# Patient Record
Sex: Male | Born: 1950
Health system: Southern US, Community
[De-identification: ages and names within clinical notes are randomized; demographics above are authoritative.]

## PROBLEM LIST (undated history)

## (undated) DIAGNOSIS — K219 Gastro-esophageal reflux disease without esophagitis: Secondary | ICD-10-CM

## (undated) DIAGNOSIS — M1990 Unspecified osteoarthritis, unspecified site: Secondary | ICD-10-CM

## (undated) HISTORY — PX: BACK SURGERY: SHX140

---

## 2018-01-19 DIAGNOSIS — L989 Disorder of the skin and subcutaneous tissue, unspecified: Secondary | ICD-10-CM | POA: Diagnosis not present

## 2018-01-19 DIAGNOSIS — R350 Frequency of micturition: Secondary | ICD-10-CM | POA: Diagnosis not present

## 2018-01-19 DIAGNOSIS — N401 Enlarged prostate with lower urinary tract symptoms: Secondary | ICD-10-CM | POA: Diagnosis not present

## 2018-01-19 DIAGNOSIS — M5136 Other intervertebral disc degeneration, lumbar region: Secondary | ICD-10-CM | POA: Diagnosis not present

## 2018-01-19 DIAGNOSIS — K219 Gastro-esophageal reflux disease without esophagitis: Secondary | ICD-10-CM | POA: Diagnosis not present

## 2018-01-19 DIAGNOSIS — N529 Male erectile dysfunction, unspecified: Secondary | ICD-10-CM | POA: Diagnosis not present

## 2018-01-19 DIAGNOSIS — Z136 Encounter for screening for cardiovascular disorders: Secondary | ICD-10-CM | POA: Diagnosis not present

## 2018-01-19 DIAGNOSIS — Z125 Encounter for screening for malignant neoplasm of prostate: Secondary | ICD-10-CM | POA: Diagnosis not present

## 2018-01-19 DIAGNOSIS — Z1211 Encounter for screening for malignant neoplasm of colon: Secondary | ICD-10-CM | POA: Diagnosis not present

## 2018-02-01 DIAGNOSIS — K5730 Diverticulosis of large intestine without perforation or abscess without bleeding: Secondary | ICD-10-CM | POA: Diagnosis not present

## 2018-02-01 DIAGNOSIS — Z1211 Encounter for screening for malignant neoplasm of colon: Secondary | ICD-10-CM | POA: Diagnosis not present

## 2018-02-22 DIAGNOSIS — D1800 Hemangioma unspecified site: Secondary | ICD-10-CM | POA: Diagnosis not present

## 2018-02-22 DIAGNOSIS — C4401 Basal cell carcinoma of skin of lip: Secondary | ICD-10-CM | POA: Diagnosis not present

## 2018-02-22 DIAGNOSIS — D485 Neoplasm of uncertain behavior of skin: Secondary | ICD-10-CM | POA: Diagnosis not present

## 2018-02-22 DIAGNOSIS — L821 Other seborrheic keratosis: Secondary | ICD-10-CM | POA: Diagnosis not present

## 2018-03-04 DIAGNOSIS — N529 Male erectile dysfunction, unspecified: Secondary | ICD-10-CM | POA: Diagnosis not present

## 2018-04-29 DIAGNOSIS — C4401 Basal cell carcinoma of skin of lip: Secondary | ICD-10-CM | POA: Diagnosis not present

## 2018-06-02 DIAGNOSIS — N401 Enlarged prostate with lower urinary tract symptoms: Secondary | ICD-10-CM | POA: Diagnosis not present

## 2018-06-02 DIAGNOSIS — K219 Gastro-esophageal reflux disease without esophagitis: Secondary | ICD-10-CM | POA: Diagnosis not present

## 2018-06-02 DIAGNOSIS — M5136 Other intervertebral disc degeneration, lumbar region: Secondary | ICD-10-CM | POA: Diagnosis not present

## 2018-06-02 DIAGNOSIS — Z125 Encounter for screening for malignant neoplasm of prostate: Secondary | ICD-10-CM | POA: Diagnosis not present

## 2018-06-02 DIAGNOSIS — N529 Male erectile dysfunction, unspecified: Secondary | ICD-10-CM | POA: Diagnosis not present

## 2018-07-26 DIAGNOSIS — Z125 Encounter for screening for malignant neoplasm of prostate: Secondary | ICD-10-CM | POA: Diagnosis not present

## 2018-07-26 DIAGNOSIS — N5201 Erectile dysfunction due to arterial insufficiency: Secondary | ICD-10-CM | POA: Diagnosis not present

## 2018-07-26 DIAGNOSIS — R3915 Urgency of urination: Secondary | ICD-10-CM | POA: Diagnosis not present

## 2018-07-30 DIAGNOSIS — Z23 Encounter for immunization: Secondary | ICD-10-CM | POA: Diagnosis not present

## 2018-08-20 DIAGNOSIS — N5201 Erectile dysfunction due to arterial insufficiency: Secondary | ICD-10-CM | POA: Diagnosis not present

## 2018-09-29 DIAGNOSIS — Z85828 Personal history of other malignant neoplasm of skin: Secondary | ICD-10-CM | POA: Diagnosis not present

## 2018-09-29 DIAGNOSIS — D485 Neoplasm of uncertain behavior of skin: Secondary | ICD-10-CM | POA: Diagnosis not present

## 2018-09-29 DIAGNOSIS — D225 Melanocytic nevi of trunk: Secondary | ICD-10-CM | POA: Diagnosis not present

## 2018-09-29 DIAGNOSIS — L821 Other seborrheic keratosis: Secondary | ICD-10-CM | POA: Diagnosis not present

## 2018-09-29 DIAGNOSIS — L570 Actinic keratosis: Secondary | ICD-10-CM | POA: Diagnosis not present

## 2018-09-29 DIAGNOSIS — L814 Other melanin hyperpigmentation: Secondary | ICD-10-CM | POA: Diagnosis not present

## 2018-09-29 DIAGNOSIS — Z23 Encounter for immunization: Secondary | ICD-10-CM | POA: Diagnosis not present

## 2018-12-17 DIAGNOSIS — Z125 Encounter for screening for malignant neoplasm of prostate: Secondary | ICD-10-CM | POA: Diagnosis not present

## 2018-12-17 DIAGNOSIS — Z0000 Encounter for general adult medical examination without abnormal findings: Secondary | ICD-10-CM | POA: Diagnosis not present

## 2018-12-17 DIAGNOSIS — Z1329 Encounter for screening for other suspected endocrine disorder: Secondary | ICD-10-CM | POA: Diagnosis not present

## 2018-12-17 DIAGNOSIS — N401 Enlarged prostate with lower urinary tract symptoms: Secondary | ICD-10-CM | POA: Diagnosis not present

## 2018-12-17 DIAGNOSIS — M5136 Other intervertebral disc degeneration, lumbar region: Secondary | ICD-10-CM | POA: Diagnosis not present

## 2018-12-17 DIAGNOSIS — Z79899 Other long term (current) drug therapy: Secondary | ICD-10-CM | POA: Diagnosis not present

## 2018-12-17 DIAGNOSIS — M25522 Pain in left elbow: Secondary | ICD-10-CM | POA: Diagnosis not present

## 2018-12-17 DIAGNOSIS — Z13220 Encounter for screening for lipoid disorders: Secondary | ICD-10-CM | POA: Diagnosis not present

## 2018-12-17 DIAGNOSIS — Z23 Encounter for immunization: Secondary | ICD-10-CM | POA: Diagnosis not present

## 2018-12-17 DIAGNOSIS — N529 Male erectile dysfunction, unspecified: Secondary | ICD-10-CM | POA: Diagnosis not present

## 2018-12-17 DIAGNOSIS — Z136 Encounter for screening for cardiovascular disorders: Secondary | ICD-10-CM | POA: Diagnosis not present

## 2019-08-26 DIAGNOSIS — Z23 Encounter for immunization: Secondary | ICD-10-CM | POA: Diagnosis not present

## 2020-01-30 DIAGNOSIS — E7800 Pure hypercholesterolemia, unspecified: Secondary | ICD-10-CM | POA: Diagnosis not present

## 2020-01-30 DIAGNOSIS — Z125 Encounter for screening for malignant neoplasm of prostate: Secondary | ICD-10-CM | POA: Diagnosis not present

## 2020-01-30 DIAGNOSIS — Z79899 Other long term (current) drug therapy: Secondary | ICD-10-CM | POA: Diagnosis not present

## 2020-01-30 DIAGNOSIS — Z0000 Encounter for general adult medical examination without abnormal findings: Secondary | ICD-10-CM | POA: Diagnosis not present

## 2020-02-02 DIAGNOSIS — H9313 Tinnitus, bilateral: Secondary | ICD-10-CM | POA: Diagnosis not present

## 2020-02-02 DIAGNOSIS — Z23 Encounter for immunization: Secondary | ICD-10-CM | POA: Diagnosis not present

## 2020-02-02 DIAGNOSIS — N401 Enlarged prostate with lower urinary tract symptoms: Secondary | ICD-10-CM | POA: Diagnosis not present

## 2020-02-02 DIAGNOSIS — M5136 Other intervertebral disc degeneration, lumbar region: Secondary | ICD-10-CM | POA: Diagnosis not present

## 2020-02-02 DIAGNOSIS — K219 Gastro-esophageal reflux disease without esophagitis: Secondary | ICD-10-CM | POA: Diagnosis not present

## 2020-02-02 DIAGNOSIS — Z0001 Encounter for general adult medical examination with abnormal findings: Secondary | ICD-10-CM | POA: Diagnosis not present

## 2020-02-02 DIAGNOSIS — E7800 Pure hypercholesterolemia, unspecified: Secondary | ICD-10-CM | POA: Diagnosis not present

## 2020-03-08 DIAGNOSIS — Z77122 Contact with and (suspected) exposure to noise: Secondary | ICD-10-CM | POA: Diagnosis not present

## 2020-03-08 DIAGNOSIS — H903 Sensorineural hearing loss, bilateral: Secondary | ICD-10-CM | POA: Diagnosis not present

## 2020-03-08 DIAGNOSIS — H9313 Tinnitus, bilateral: Secondary | ICD-10-CM | POA: Diagnosis not present

## 2020-03-20 DIAGNOSIS — R3915 Urgency of urination: Secondary | ICD-10-CM | POA: Diagnosis not present

## 2020-03-20 DIAGNOSIS — R972 Elevated prostate specific antigen [PSA]: Secondary | ICD-10-CM | POA: Diagnosis not present

## 2020-03-22 DIAGNOSIS — M5412 Radiculopathy, cervical region: Secondary | ICD-10-CM | POA: Diagnosis not present

## 2020-05-04 DIAGNOSIS — E7800 Pure hypercholesterolemia, unspecified: Secondary | ICD-10-CM | POA: Diagnosis not present

## 2020-08-02 DIAGNOSIS — Z23 Encounter for immunization: Secondary | ICD-10-CM | POA: Diagnosis not present

## 2020-09-11 DIAGNOSIS — R972 Elevated prostate specific antigen [PSA]: Secondary | ICD-10-CM | POA: Diagnosis not present

## 2020-09-18 DIAGNOSIS — R3915 Urgency of urination: Secondary | ICD-10-CM | POA: Diagnosis not present

## 2020-09-18 DIAGNOSIS — R972 Elevated prostate specific antigen [PSA]: Secondary | ICD-10-CM | POA: Diagnosis not present

## 2020-09-18 DIAGNOSIS — N5201 Erectile dysfunction due to arterial insufficiency: Secondary | ICD-10-CM | POA: Diagnosis not present

## 2020-10-29 DIAGNOSIS — R972 Elevated prostate specific antigen [PSA]: Secondary | ICD-10-CM | POA: Diagnosis not present

## 2020-10-29 DIAGNOSIS — C61 Malignant neoplasm of prostate: Secondary | ICD-10-CM | POA: Diagnosis not present

## 2020-11-06 DIAGNOSIS — N5201 Erectile dysfunction due to arterial insufficiency: Secondary | ICD-10-CM | POA: Diagnosis not present

## 2020-11-06 DIAGNOSIS — C61 Malignant neoplasm of prostate: Secondary | ICD-10-CM | POA: Diagnosis not present

## 2020-11-06 DIAGNOSIS — R3915 Urgency of urination: Secondary | ICD-10-CM | POA: Diagnosis not present

## 2020-12-18 DIAGNOSIS — L820 Inflamed seborrheic keratosis: Secondary | ICD-10-CM | POA: Diagnosis not present

## 2020-12-18 DIAGNOSIS — L570 Actinic keratosis: Secondary | ICD-10-CM | POA: Diagnosis not present

## 2020-12-18 DIAGNOSIS — L814 Other melanin hyperpigmentation: Secondary | ICD-10-CM | POA: Diagnosis not present

## 2020-12-18 DIAGNOSIS — L578 Other skin changes due to chronic exposure to nonionizing radiation: Secondary | ICD-10-CM | POA: Diagnosis not present

## 2020-12-18 DIAGNOSIS — D1800 Hemangioma unspecified site: Secondary | ICD-10-CM | POA: Diagnosis not present

## 2020-12-18 DIAGNOSIS — Z85828 Personal history of other malignant neoplasm of skin: Secondary | ICD-10-CM | POA: Diagnosis not present

## 2020-12-18 DIAGNOSIS — L821 Other seborrheic keratosis: Secondary | ICD-10-CM | POA: Diagnosis not present

## 2021-02-01 DIAGNOSIS — Z79899 Other long term (current) drug therapy: Secondary | ICD-10-CM | POA: Diagnosis not present

## 2021-02-01 DIAGNOSIS — R972 Elevated prostate specific antigen [PSA]: Secondary | ICD-10-CM | POA: Diagnosis not present

## 2021-02-01 DIAGNOSIS — E7800 Pure hypercholesterolemia, unspecified: Secondary | ICD-10-CM | POA: Diagnosis not present

## 2021-02-01 DIAGNOSIS — Z0000 Encounter for general adult medical examination without abnormal findings: Secondary | ICD-10-CM | POA: Diagnosis not present

## 2021-02-05 DIAGNOSIS — Z0001 Encounter for general adult medical examination with abnormal findings: Secondary | ICD-10-CM | POA: Diagnosis not present

## 2021-02-05 DIAGNOSIS — N529 Male erectile dysfunction, unspecified: Secondary | ICD-10-CM | POA: Diagnosis not present

## 2021-02-05 DIAGNOSIS — K219 Gastro-esophageal reflux disease without esophagitis: Secondary | ICD-10-CM | POA: Diagnosis not present

## 2021-02-05 DIAGNOSIS — E7800 Pure hypercholesterolemia, unspecified: Secondary | ICD-10-CM | POA: Diagnosis not present

## 2021-02-05 DIAGNOSIS — N401 Enlarged prostate with lower urinary tract symptoms: Secondary | ICD-10-CM | POA: Diagnosis not present

## 2021-02-05 DIAGNOSIS — C61 Malignant neoplasm of prostate: Secondary | ICD-10-CM | POA: Diagnosis not present

## 2021-02-20 ENCOUNTER — Other Ambulatory Visit: Payer: Self-pay | Admitting: Physician Assistant

## 2021-02-20 DIAGNOSIS — R1319 Other dysphagia: Secondary | ICD-10-CM | POA: Diagnosis not present

## 2021-02-20 DIAGNOSIS — Z1211 Encounter for screening for malignant neoplasm of colon: Secondary | ICD-10-CM | POA: Diagnosis not present

## 2021-02-20 DIAGNOSIS — K2100 Gastro-esophageal reflux disease with esophagitis, without bleeding: Secondary | ICD-10-CM | POA: Diagnosis not present

## 2021-03-07 ENCOUNTER — Ambulatory Visit
Admission: RE | Admit: 2021-03-07 | Discharge: 2021-03-07 | Disposition: A | Discharge status: Home / Self Care | Payer: PPO | Source: Ambulatory Visit | Attending: Physician Assistant | Admitting: Physician Assistant

## 2021-03-07 DIAGNOSIS — R1319 Other dysphagia: Secondary | ICD-10-CM

## 2021-03-07 DIAGNOSIS — R1310 Dysphagia, unspecified: Secondary | ICD-10-CM | POA: Diagnosis not present

## 2021-03-27 DIAGNOSIS — R1310 Dysphagia, unspecified: Secondary | ICD-10-CM | POA: Diagnosis not present

## 2021-03-27 DIAGNOSIS — K2900 Acute gastritis without bleeding: Secondary | ICD-10-CM | POA: Diagnosis not present

## 2021-03-27 DIAGNOSIS — K449 Diaphragmatic hernia without obstruction or gangrene: Secondary | ICD-10-CM | POA: Diagnosis not present

## 2021-04-04 DIAGNOSIS — H40013 Open angle with borderline findings, low risk, bilateral: Secondary | ICD-10-CM | POA: Diagnosis not present

## 2021-04-28 IMAGING — RF DG ESOPHAGUS
1 series · 10 of 10 positions shown · non-contrast
Comparison: None.

CLINICAL DATA: Esophageal dysphagia. Gastroesophageal reflux
disease.

EXAM:
ESOPHOGRAM / BARIUM SWALLOW / BARIUM TABLET STUDY
TECHNIQUE: Combined double contrast and single contrast examination performed
using effervescent crystals, thick barium liquid, and thin barium
liquid. The patient was observed with fluoroscopy swallowing a 13 mm
barium sulphate tablet.
FLUOROSCOPY TIME:  Fluoroscopy Time:  1 minutes 36 seconds
Radiation Exposure Index (if provided by the fluoroscopic device):
12.1 mGy
Number of Acquired Spot Images: 0

[Series 1: one shot · 10 of 10 slices shown]
[im 1/10]
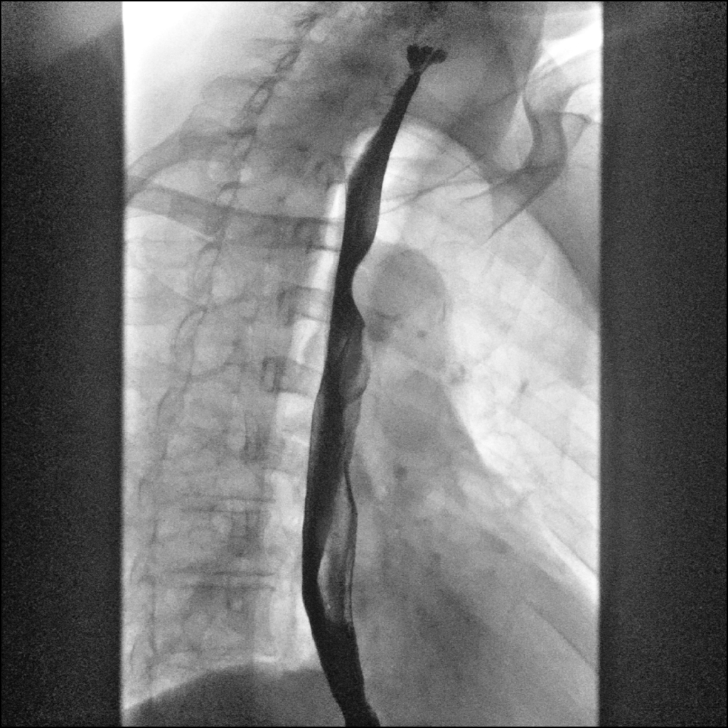
[im 2/10]
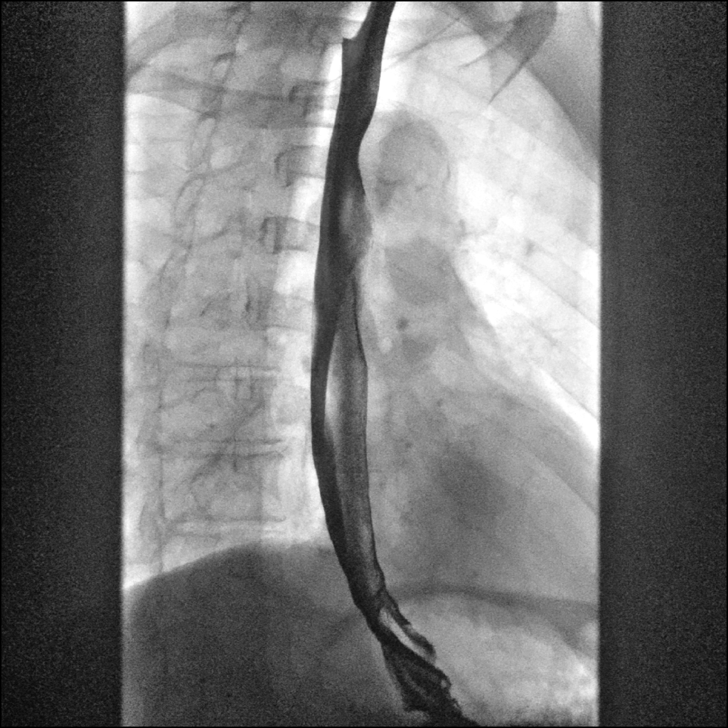
[im 3/10]
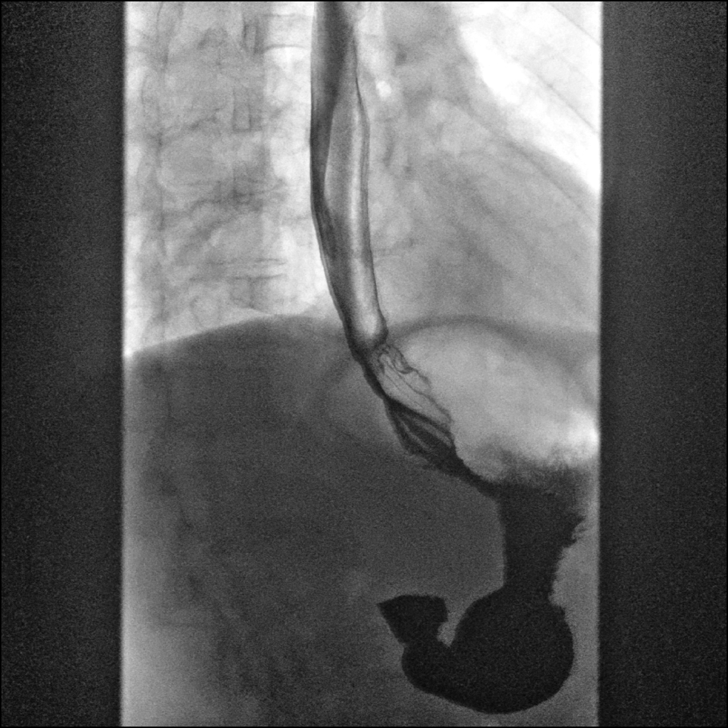
[im 4/10]
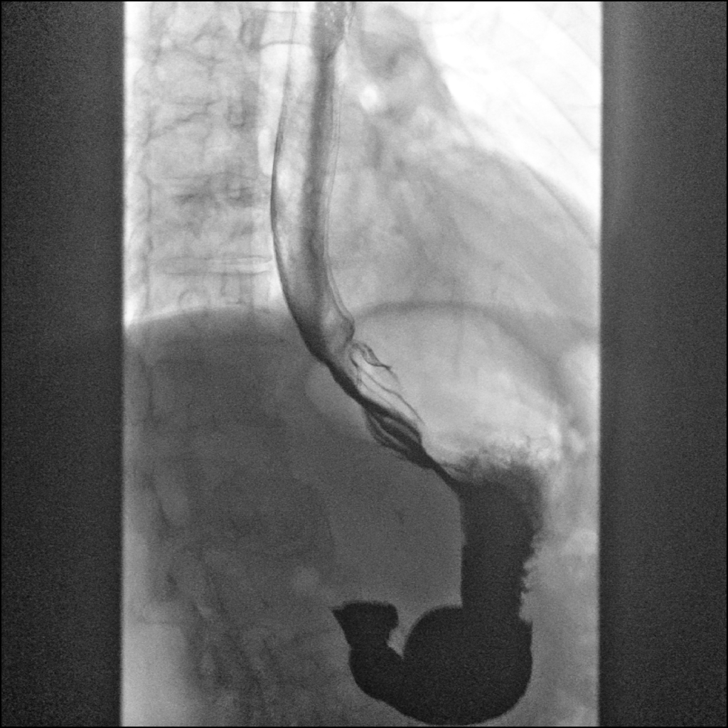
[im 5/10]
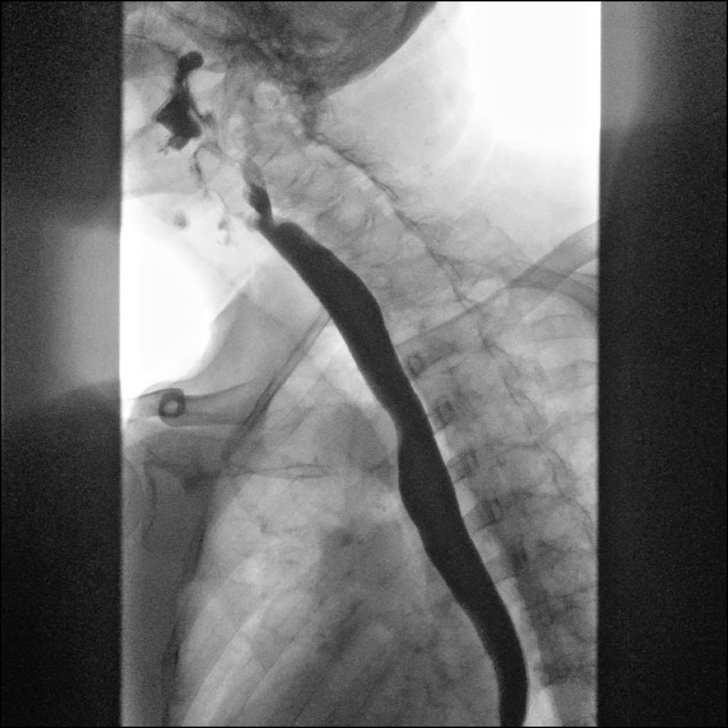
[im 6/10]
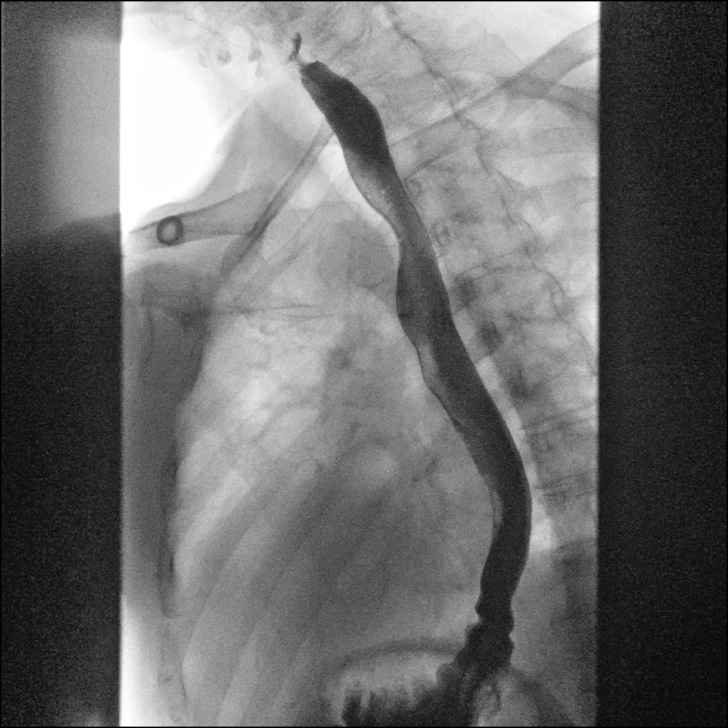
[im 7/10]
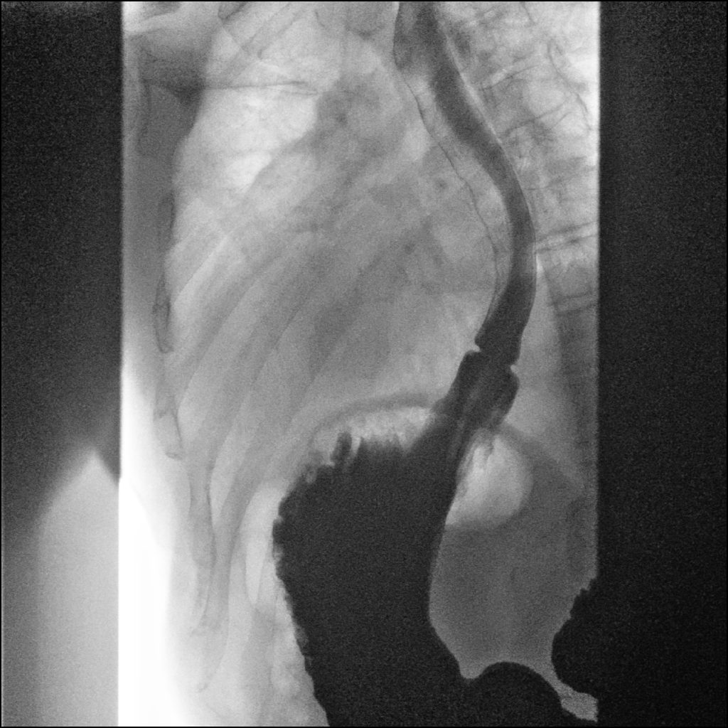
[im 8/10]
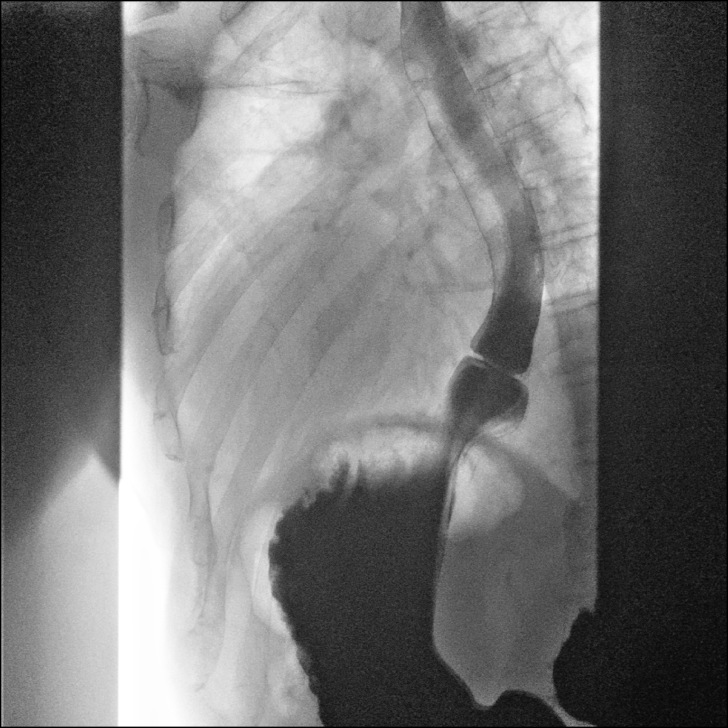
[im 9/10]
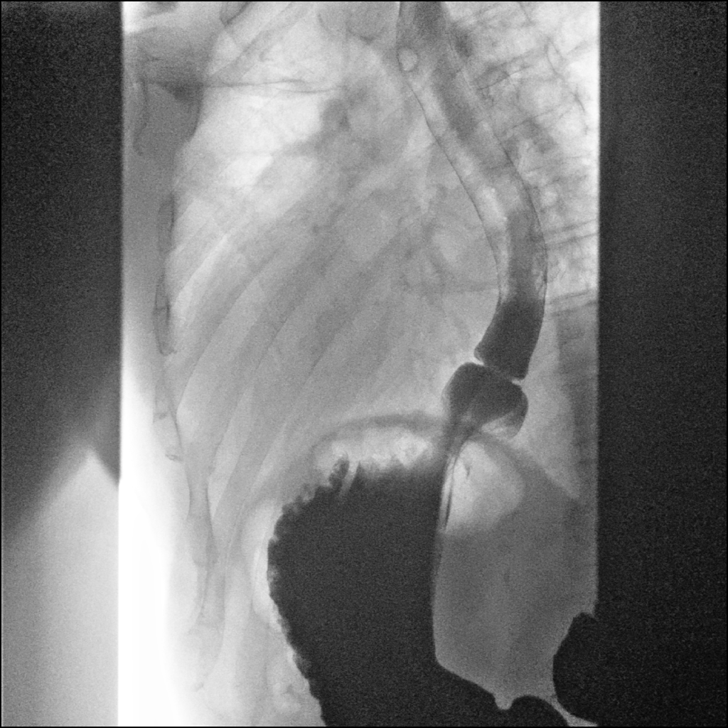
[im 10/10]
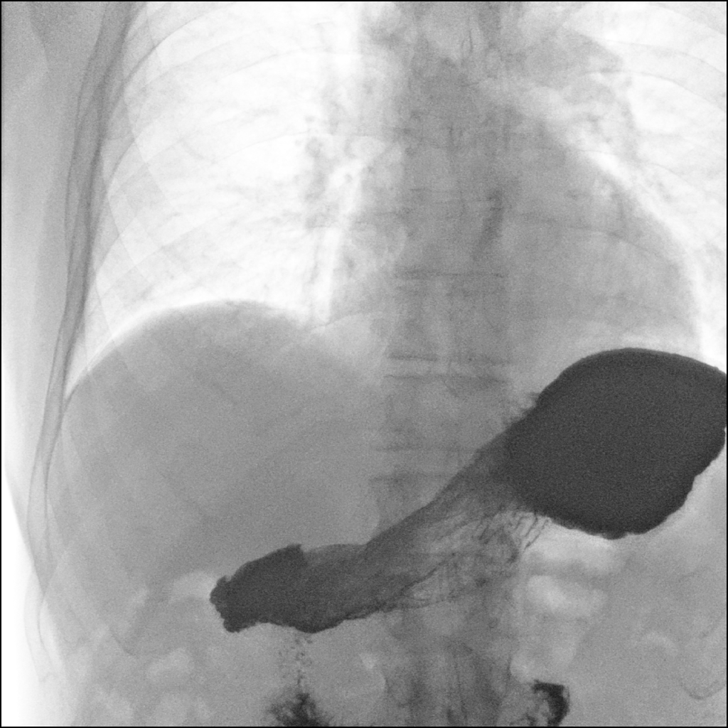

[10 of 10 positions shown; findings below may reference images not displayed]

FINDINGS: No evidence of esophageal mass or stricture. No findings of
esophagitis noted. Esophageal motility is within normal limits. A
single episode of a small amount of barium was seen which did not
trigger a cough reflex.

A tiny sliding hiatal hernia is seen. Minimal gastroesophageal
reflux was seen to the level of the distal thoracic esophagus. An
ingested 13mm barium tablet passed freely through the esophagus, and
into the stomach.
IMPRESSION: Tiny sliding hiatal hernia.

Minimal gastroesophageal reflux.

No evidence of esophageal stricture or mass.

Small amount of of silent aspiration was noted.

## 2021-05-01 DIAGNOSIS — C61 Malignant neoplasm of prostate: Secondary | ICD-10-CM | POA: Diagnosis not present

## 2021-05-07 DIAGNOSIS — R3915 Urgency of urination: Secondary | ICD-10-CM | POA: Diagnosis not present

## 2021-05-07 DIAGNOSIS — C61 Malignant neoplasm of prostate: Secondary | ICD-10-CM | POA: Diagnosis not present

## 2021-05-07 DIAGNOSIS — N5201 Erectile dysfunction due to arterial insufficiency: Secondary | ICD-10-CM | POA: Diagnosis not present

## 2021-07-25 DIAGNOSIS — Z23 Encounter for immunization: Secondary | ICD-10-CM | POA: Diagnosis not present

## 2021-10-18 ENCOUNTER — Other Ambulatory Visit (HOSPITAL_COMMUNITY): Payer: Self-pay | Admitting: Urology

## 2021-10-18 ENCOUNTER — Other Ambulatory Visit: Payer: Self-pay | Admitting: Urology

## 2021-10-18 DIAGNOSIS — C61 Malignant neoplasm of prostate: Secondary | ICD-10-CM

## 2021-10-30 ENCOUNTER — Ambulatory Visit (HOSPITAL_COMMUNITY)
Admission: RE | Admit: 2021-10-30 | Discharge: 2021-10-30 | Disposition: A | Discharge status: Home / Self Care | Payer: PPO | Source: Ambulatory Visit | Attending: Urology | Admitting: Urology

## 2021-10-30 ENCOUNTER — Other Ambulatory Visit: Payer: Self-pay

## 2021-10-30 DIAGNOSIS — N429 Disorder of prostate, unspecified: Secondary | ICD-10-CM | POA: Diagnosis not present

## 2021-10-30 DIAGNOSIS — R590 Localized enlarged lymph nodes: Secondary | ICD-10-CM | POA: Diagnosis not present

## 2021-10-30 DIAGNOSIS — C61 Malignant neoplasm of prostate: Secondary | ICD-10-CM | POA: Diagnosis not present

## 2021-10-30 DIAGNOSIS — K5790 Diverticulosis of intestine, part unspecified, without perforation or abscess without bleeding: Secondary | ICD-10-CM | POA: Diagnosis not present

## 2021-10-30 MED ORDER — GADOBUTROL 1 MMOL/ML IV SOLN
7.0000 mL | Freq: Once | INTRAVENOUS | Status: AC | PRN
  Administered 2021-10-30: 17:00:00 7 mL via INTRAVENOUS

## 2021-11-05 DIAGNOSIS — R3915 Urgency of urination: Secondary | ICD-10-CM | POA: Diagnosis not present

## 2021-11-05 DIAGNOSIS — N5201 Erectile dysfunction due to arterial insufficiency: Secondary | ICD-10-CM | POA: Diagnosis not present

## 2021-11-05 DIAGNOSIS — C61 Malignant neoplasm of prostate: Secondary | ICD-10-CM | POA: Diagnosis not present

## 2022-05-05 DIAGNOSIS — R3915 Urgency of urination: Secondary | ICD-10-CM

## 2022-05-05 DIAGNOSIS — N529 Male erectile dysfunction, unspecified: Secondary | ICD-10-CM

## 2022-05-05 HISTORY — DX: Male erectile dysfunction, unspecified: N52.9

## 2022-05-05 HISTORY — DX: Urgency of urination: R39.15

## 2022-10-23 DIAGNOSIS — C61 Malignant neoplasm of prostate: Secondary | ICD-10-CM

## 2022-10-23 HISTORY — PX: OTHER SURGICAL HISTORY: SHX169

## 2022-10-23 HISTORY — DX: Malignant neoplasm of prostate: C61

## 2022-11-12 ENCOUNTER — Telehealth: Payer: Self-pay | Admitting: Radiation Oncology

## 2022-11-12 ENCOUNTER — Encounter: Payer: Self-pay | Admitting: Radiation Oncology

## 2022-11-12 NOTE — Progress Notes (Signed)
GU Location of Tumor / Histology: Prostate Ca  If Prostate Cancer, Gleason Score is (4 + 3) and PSA is (7.98 on 10/20/2022)  Biopsies       Past/Anticipated interventions by urology, if any:     Past/Anticipated interventions by medical oncology, if any:  NA  Weight changes, if any:  No  IPSS:  15 SHIM:  20  Bowel/Bladder complaints, if any:  Frequency in urination.  No bowel issues.  Nausea/Vomiting, if any: No  Pain issues, if any:  0/10  SAFETY ISSUES: Prior radiation?  No Pacemaker/ICD? No Possible current pregnancy? Male Is the patient on methotrexate? No  Current Complaints / other details:  Need more information on treatment options.

## 2022-11-12 NOTE — Telephone Encounter (Signed)
12/27 @ 10:05 am Left voicemail for patient to call our office to be schedule for consult with Dr. Tammi Klippel.

## 2022-11-14 NOTE — Progress Notes (Shared)
Radiation Oncology         [phone] ________________________________  Initial Outpatient Consultation  Name: Trevor Wilson. MRN: [id number]  Date: 11/19/2022  DOB: Aug 23, 1951  SP:[id number], Dibas, MD  Alexis Frock, MD   REFERRING PHYSICIAN: Alexis Frock, MD  DIAGNOSIS: 71 y.o. gentleman with Stage T1c adenocarcinoma of the prostate with Gleason score of 4+3, and PSA of 7.98.  No diagnosis found.  HISTORY OF PRESENT ILLNESS: Trevor Wilson. 71 y.o. male with a diagnosis of prostate cancer. He was first diagnosed with moderate risk prostate cancer in December of 2021 when biopsy showed 4/12 cores positive with up to 20% grade 1 cancer. He opted for active surveillance at that time with Dr. Tresa Moore. Of note, patient is also seen by Dr. Tresa Moore for lower urinary tract symptoms and years of progressive erectile dysfunction. PSA on 04/2021 was 5.38. PSA increased to 6.12 on 03/2022 and continued to rise to 7.98 when checked 10/2022. The patient proceeded to MRI fusion + template biopsy on 22/63/33.  The prostate volume measured 49 cc.  Out of 14 core biopsies, 7 were positive.  The maximum Gleason score was 4+3, and this was seen in the prostate ROI MRI lesion and left apex. Additionally samples in the left apex lateral, left middle, right middle, and right apex showed a gleason score of 3+3.  The patient reviewed the biopsy results with his urologist and he has kindly been referred today for discussion of potential radiation treatment options.   PREVIOUS RADIATION THERAPY: {EXAM; YES/NO:19492::"No"}  PAST MEDICAL HISTORY:  Past Medical History:  Diagnosis Date   ED (erectile dysfunction) due to arterial insufficiency 05/05/2022   11/05/2021   Prostate cancer ([place]) 10/23/2022   05/05/2022, 11/05/2021   Urinary urgency 05/05/2022   11/05/2021      PAST SURGICAL HISTORY:*** The histories are not reviewed yet. Please review them in the "History" navigator section and  refresh this [place].  FAMILY HISTORY: No family history on file.  SOCIAL HISTORY:  Social History   Socioeconomic History   Marital status: Married    Spouse name: Not on file   Number of children: Not on file   Years of education: Not on file   Highest education level: Not on file  Occupational History   Not on file  Tobacco Use   Smoking status: Never   Smokeless tobacco: Never  Substance and Sexual Activity   Alcohol use: Yes    Comment: Has 2 driniks per month.   Drug use: Never   Sexual activity: Not on file  Other Topics Concern   Not on file  Social History Narrative   Not on file   Social Determinants of Health   Financial Resource Strain: Not on file  Food Insecurity: Not on file  Transportation Needs: Not on file  Physical Activity: Not on file  Stress: Not on file  Social Connections: Not on file  Intimate Partner Violence: Not on file    ALLERGIES: Patient has no allergy information on record.  MEDICATIONS:  Current Outpatient Medications  Medication Sig Dispense Refill   tadalafil (CIALIS) 10 MG tablet Take by mouth.     diclofenac (VOLTAREN) 75 MG EC tablet Take 75 mg by mouth 2 (two) times daily.     Multiple Vitamins-Minerals (CENTRUM SILVER 50+MEN) TABS Take by mouth.     omeprazole (PRILOSEC) 40 MG capsule Take 40 mg by mouth every morning.     rosuvastatin (CRESTOR) 5 MG tablet Take  5 mg by mouth daily.     SAW PALMETTO, SERENOA REPENS, PO Take by mouth.     tamsulosin (FLOMAX) 0.4 MG CAPS capsule Take 0.4 mg by mouth 2 (two) times daily.     No current facility-administered medications for this visit.    REVIEW OF SYSTEMS:  On review of systems, the patient reports that he is doing well overall. He denies any chest pain, shortness of breath, cough, fevers, chills, night sweats, unintended weight changes. He denies any bowel disturbances, and denies abdominal pain, nausea or vomiting. He denies any new musculoskeletal or joint aches or  pains. His IPSS was  , indicating *** urinary symptoms (Reference 0-7 mild, 8-19 moderate, 20-35 severe).  His SHIM was  , indicating he {does not have/has mild/moderate/severe} erectile dysfunction (Reference - 22-25 None, 17-21 Mild, 8-16 Moderate, 1-7 Severe). A complete review of systems is obtained and is otherwise negative.   PHYSICAL EXAM:  Wt Readings from Last 3 Encounters:  No data found for Wt   Temp Readings from Last 3 Encounters:  No data found for Temp   BP Readings from Last 3 Encounters:  No data found for BP   Pulse Readings from Last 3 Encounters:  No data found for Pulse    /10  In general this is a well appearing gentleman in no acute distress. He's alert and oriented x4 and appropriate throughout the examination. Cardiopulmonary assessment is negative for acute distress, and he exhibits normal effort.    KPS = ***  100 - Normal; no complaints; no evidence of disease. 90   - Able to carry on normal activity; minor signs or symptoms of disease. 80   - Normal activity with effort; some signs or symptoms of disease. 76   - Cares for self; unable to carry on normal activity or to do active work. 60   - Requires occasional assistance, but is able to care for most of his personal needs. 50   - Requires considerable assistance and frequent medical care. 33   - Disabled; requires special care and assistance. 63   - Severely disabled; hospital admission is indicated although death not imminent. 7   - Very sick; hospital admission necessary; active supportive treatment necessary. 10   - Moribund; fatal processes progressing rapidly. 0     - Dead  Karnofsky DA, Abelmann WH, Craver LS and Burchenal JH [phone]) The use of the nitrogen mustards in the palliative treatment of carcinoma: with particular reference to bronchogenic carcinoma Cancer 1 634-56  LABORATORY DATA:  No results found for: "WBC", "HGB", "HCT", "MCV", "PLT" No results found for: "NA", "K", "CL", "CO2" No  results found for: "ALT", "AST", "GGT", "ALKPHOS", "BILITOT"   RADIOGRAPHY: No results found.    IMPRESSION/PLAN:  71 y.o. gentleman with Stage T1c adenocarcinoma of the prostate with Gleason score of 4+3, and PSA of 7.98. We discussed the patient's workup and outlined the nature of prostate cancer in this setting. The patient's T stage, Gleason's score, and PSA put him into the *** risk group. Accordingly, he is eligible for a variety of potential treatment options including {ADT in combination with} brachytherapy, 5.5-8 weeks of external radiation, {5 weeks of external radiation with an upfront brachytherapy boost,} or prostatectomy. We discussed the available radiation techniques, and focused on the details and logistics of delivery. {The patient may not be an ideal candidate for brachytherapy boost with a prostate volume of *** prior to downsizing from hormone therapy. We discussed that based  on his prostate volume, he would require beginning treatment with a 5 alpha reductase inhibitor and ADT for at least 3 months to allow for downsizing of the prostate prior to initiating radiotherapy.} We discussed and outlined the risks, benefits, short and long-term effects associated with radiotherapy and compared and contrasted these with prostatectomy. We discussed the role of SpaceOAR gel in reducing the rectal toxicity associated with radiotherapy. {We also detailed the role of ADT in the treatment of *** risk prostate cancer and outlined the associated side effects that could be expected with this therapy.}  He appears to have a good understanding of his disease and our treatment recommendations which are of curative intent.  He was encouraged to ask questions that were answered to his stated satisfaction.  At the conclusion of our conversation, the patient is interested in moving forward with ***. At the conclusion of our conversation, the patient is interested in moving forward with the recommended  brachytherapy. The patient appears to have a good understanding of his disease and our treatment recommendations which are of *** intent and is in agreement with the stated plan.  He knows that he is welcome to call at anytime with any questions or concerns related to the radiation treatment plan.  We personally spent *** minutes in this encounter including chart review, reviewing radiological studies, meeting face-to-face with the patient, entering orders and completing documentation.      Tyler Pita, MD  [facility]  Radiation Oncology Direct Dial: [phone]  Fax: [phone] [email or web address]  Skype  LinkedIn

## 2022-11-19 ENCOUNTER — Ambulatory Visit
Admission: RE | Admit: 2022-11-19 | Discharge: 2022-11-19 | Disposition: A | Discharge status: Home / Self Care | Payer: PPO | Source: Ambulatory Visit | Attending: Radiation Oncology | Admitting: Radiation Oncology

## 2022-11-19 ENCOUNTER — Other Ambulatory Visit: Payer: Self-pay

## 2022-11-19 ENCOUNTER — Encounter: Payer: Self-pay | Admitting: Radiation Oncology

## 2022-11-19 VITALS — BP 132/78 | HR 72 | Temp 97.8°F | Resp 18 | Ht 70.0 in | Wt 175.0 lb

## 2022-11-19 DIAGNOSIS — C61 Malignant neoplasm of prostate: Secondary | ICD-10-CM

## 2022-11-19 DIAGNOSIS — Z79899 Other long term (current) drug therapy: Secondary | ICD-10-CM | POA: Diagnosis not present

## 2022-11-24 NOTE — Progress Notes (Signed)
Patient was a RadOnc on 1/3 for his stage T1c adenocarcinoma of the prostate with Gleason score of 4+3, and PSA of 7.98.   Patient will have a follow up with Dr. Tresa Moore at [facility] on 1/15.  RN will follow up after to ensure treatment decision is finalized.

## 2022-12-01 NOTE — Progress Notes (Signed)
Patient was a consult on 1/3 for his stage T1c adenocarcinoma of the prostate with Gleason score of 4+3, and PSA of 7.98.  Patient was undecided on treatment decision, however, has followed up with Dr. Tresa Moore and will proceed with surgery.

## 2022-12-08 ENCOUNTER — Other Ambulatory Visit: Payer: Self-pay | Admitting: Urology

## 2022-12-30 ENCOUNTER — Encounter (HOSPITAL_COMMUNITY)
Admission: RE | Admit: 2022-12-30 | Discharge: 2022-12-30 | Disposition: A | Discharge status: Home / Self Care | Payer: PPO | Source: Ambulatory Visit | Attending: Urology | Admitting: Urology

## 2022-12-30 ENCOUNTER — Encounter (HOSPITAL_COMMUNITY): Payer: Self-pay

## 2022-12-30 VITALS — BP 135/95 | Resp 16 | Ht 70.0 in

## 2022-12-30 DIAGNOSIS — C61 Malignant neoplasm of prostate: Secondary | ICD-10-CM | POA: Insufficient documentation

## 2022-12-30 DIAGNOSIS — Z01818 Encounter for other preprocedural examination: Secondary | ICD-10-CM

## 2022-12-30 DIAGNOSIS — Z01812 Encounter for preprocedural laboratory examination: Secondary | ICD-10-CM | POA: Insufficient documentation

## 2022-12-30 HISTORY — DX: Gastro-esophageal reflux disease without esophagitis: K21.9

## 2022-12-30 HISTORY — DX: Unspecified osteoarthritis, unspecified site: M19.90

## 2022-12-30 LAB — CBC
HCT: 38.1 % — ABNORMAL LOW (ref 39.0–52.0)
Hemoglobin: 13.1 g/dL (ref 13.0–17.0)
MCH: 33.5 pg (ref 26.0–34.0)
MCHC: 34.4 g/dL (ref 30.0–36.0)
MCV: 97.4 fL (ref 80.0–100.0)
Platelets: 163 10*3/uL (ref 150–400)
RBC: 3.91 MIL/uL — ABNORMAL LOW (ref 4.22–5.81)
RDW: 11.9 % (ref 11.5–15.5)
WBC: 6.9 10*3/uL (ref 4.0–10.5)
nRBC: 0 % (ref 0.0–0.2)

## 2022-12-30 NOTE — Progress Notes (Addendum)
COVID Vaccine Completed: yes  Date of COVID positive in last 90 days: no  PCP - Dibas Koirala, MD Cardiologist - n/a  Chest x-ray - n/a EKG - n/a Stress Test - n/a ECHO - n/a Cardiac Cath - n/a Pacemaker/ICD device last checked: n/a Spinal Cord Stimulator: n/a  Bowel Prep - clear liquid diet day before, mag citrate   Sleep Study - n/a CPAP -   Fasting Blood Sugar - n/a Checks Blood Sugar _____ times a day  Last dose of GLP1 agonist-  N/A GLP1 instructions:  N/A   Last dose of SGLT-2 inhibitors-  N/A SGLT-2 instructions: N/A   Blood Thinner Instructions: n/a Aspirin Instructions: Last Dose:  Activity level: Can go up a flight of stairs and perform activities of daily living without stopping and without symptoms of chest pain or shortness of breath.   Anesthesia review:   Patient denies shortness of breath, fever, cough and chest pain at PAT appointment  Patient verbalized understanding of instructions that were given to them at the PAT appointment. Patient was also instructed that they will need to review over the PAT instructions again at home before surgery.

## 2022-12-30 NOTE — Patient Instructions (Addendum)
SURGICAL WAITING ROOM VISITATION  Patients having surgery or a procedure may have no more than 2 support people in the waiting area - these visitors may rotate.    Children under the age of 30 must have an adult with them who is not the patient.  Due to an increase in RSV and influenza rates and associated hospitalizations, children ages 28 and under may not visit patients in [place].  If the patient needs to stay at the hospital during part of their recovery, the visitor guidelines for inpatient rooms apply. Pre-op nurse will coordinate an appropriate time for 1 support person to accompany patient in pre-op.  This support person may not rotate.    Please refer to the [facility] website for the visitor guidelines for Inpatients (after your surgery is over and you are in a regular room).    Your procedure is scheduled on: 01/07/23   Report to [facility] Main Entrance    Report to admitting at 10:45 AM   Call this number if you have problems the morning of surgery [phone]   Follow a clear liquid diet the day before surgery.    Nothing to drink after midnight the night before surgery.  Water Non-Citrus Juices (without pulp, NO RED-Apple, White grape, White cranberry) Black Coffee (NO MILK/CREAM OR CREAMERS, sugar ok)  Clear Tea (NO MILK/CREAM OR CREAMERS, sugar ok) regular and decaf                             Plain Jell-O (NO RED)                                           Fruit ices (not with fruit pulp, NO RED)                                     Popsicles (NO RED)                                                               Sports drinks like Gatorade (NO RED)          If you have questions, please contact your surgeon's office.   FOLLOW BOWEL PREP AND ANY ADDITIONAL PRE OP INSTRUCTIONS YOU RECEIVED FROM YOUR SURGEON'S OFFICE!!!     Oral Hygiene is also important to reduce your risk of infection.                                    Remember - BRUSH  YOUR TEETH THE MORNING OF SURGERY WITH YOUR REGULAR TOOTHPASTE  DENTURES WILL BE REMOVED PRIOR TO SURGERY PLEASE DO NOT APPLY "Poly grip" OR ADHESIVES!!!   Take these medicines the morning of surgery with A SIP OF WATER: Tylenol, Zyrtec, Omeprazole, Rosuvastatin, Tamsulosin                              You may not have any metal  on your body including jewelry, and body piercing             Do not wear lotions, powders, cologne, or deodorant  Do not shave  48 hours prior to surgery.               Men may shave face and neck.   Do not bring valuables to the hospital. [place].   Contacts, glasses, dentures or bridgework may not be worn into surgery.   Bring small overnight bag day of surgery.   DO NOT [place]. PHARMACY WILL DISPENSE MEDICATIONS LISTED ON YOUR MEDICATION LIST TO YOU DURING YOUR ADMISSION [place]!   Special Instructions: Bring a copy of your healthcare power of attorney and living will documents the day of surgery if you haven't scanned them before.              Please read over the following fact sheets you were given: IF [name] [phone] Wilson    If you received a COVID test during your pre-op visit  it is requested that you wear a mask when out in public, stay away from anyone that may not be feeling well and notify your surgeon if you develop symptoms. If you test positive for Covid or have been in contact with anyone that has tested positive in the last 10 days please notify you surgeon.    [place] - Preparing for Surgery Before surgery, you can play an important role.  Because skin is not sterile, your skin needs to be as free of germs as possible.  You can reduce the number of germs on your skin by washing with CHG (chlorahexidine gluconate) soap before surgery.  CHG is an antiseptic cleaner which kills germs  and bonds with the skin to continue killing germs even after washing. Please DO NOT use if you have an allergy to CHG or antibacterial soaps.  If your skin becomes reddened/irritated stop using the CHG and inform your nurse when you arrive at Short Stay. Do not shave (including legs and underarms) for at least 48 hours prior to the first CHG shower.  You may shave your face/neck.  Please follow these instructions carefully:  1.  Shower with CHG Soap the night before surgery and the  morning of surgery.  2.  If you choose to wash your hair, wash your hair first as usual with your normal  shampoo.  3.  After you shampoo, rinse your hair and body thoroughly to remove the shampoo.                             4.  Use CHG as you would any other liquid soap.  You can apply chg directly to the skin and wash.  Gently with a scrungie or clean washcloth.  5.  Apply the CHG Soap to your body ONLY FROM THE NECK DOWN.   Do   not use on face/ open                           Wound or open sores. Avoid contact with eyes, ears mouth and   genitals (private parts).  Wash face,  Genitals (private parts) with your normal soap.             6.  Wash thoroughly, paying special attention to the area where your    surgery  will be performed.  7.  Thoroughly rinse your body with warm water from the neck down.  8.  DO NOT shower/wash with your normal soap after using and rinsing off the CHG Soap.                9.  Pat yourself dry with a clean towel.            10.  Wear clean pajamas.            11.  Place clean sheets on your bed the night of your first shower and do not  sleep with pets. Day of Surgery : Do not apply any lotions/deodorants the morning of surgery.  Please wear clean clothes to the hospital/surgery center.  FAILURE TO FOLLOW THESE INSTRUCTIONS MAY RESULT IN THE CANCELLATION OF YOUR SURGERY  PATIENT SIGNATURE_________________________________  NURSE  SIGNATURE__________________________________  ________________________________________________________________________

## 2023-01-07 ENCOUNTER — Ambulatory Visit (HOSPITAL_COMMUNITY): Payer: PPO | Admitting: Anesthesiology

## 2023-01-07 ENCOUNTER — Encounter (HOSPITAL_COMMUNITY)
Admission: RE | Disposition: A | Discharge status: Home / Self Care | Payer: Self-pay | Source: Ambulatory Visit | Attending: Urology

## 2023-01-07 ENCOUNTER — Ambulatory Visit (HOSPITAL_BASED_OUTPATIENT_CLINIC_OR_DEPARTMENT_OTHER): Payer: PPO | Admitting: Anesthesiology

## 2023-01-07 ENCOUNTER — Observation Stay (HOSPITAL_COMMUNITY)
Admission: RE | Admit: 2023-01-07 | Discharge: 2023-01-08 | Disposition: A | Discharge status: Home / Self Care | Payer: PPO | Source: Ambulatory Visit | Attending: Urology | Admitting: Urology

## 2023-01-07 ENCOUNTER — Other Ambulatory Visit: Payer: Self-pay

## 2023-01-07 ENCOUNTER — Encounter (HOSPITAL_COMMUNITY): Payer: Self-pay | Admitting: Urology

## 2023-01-07 DIAGNOSIS — C61 Malignant neoplasm of prostate: Principal | ICD-10-CM | POA: Diagnosis present

## 2023-01-07 DIAGNOSIS — D360 Benign neoplasm of lymph nodes: Secondary | ICD-10-CM | POA: Insufficient documentation

## 2023-01-07 DIAGNOSIS — D219 Benign neoplasm of connective and other soft tissue, unspecified: Secondary | ICD-10-CM | POA: Insufficient documentation

## 2023-01-07 DIAGNOSIS — R7309 Other abnormal glucose: Secondary | ICD-10-CM | POA: Insufficient documentation

## 2023-01-07 HISTORY — PX: LYMPH NODE DISSECTION: SHX5087

## 2023-01-07 HISTORY — PX: ROBOT ASSISTED LAPAROSCOPIC RADICAL PROSTATECTOMY: SHX5141

## 2023-01-07 LAB — HEMOGLOBIN AND HEMATOCRIT, BLOOD
HCT: 37.3 % — ABNORMAL LOW (ref 39.0–52.0)
Hemoglobin: 12.8 g/dL — ABNORMAL LOW (ref 13.0–17.0)

## 2023-01-07 LAB — TYPE AND SCREEN
ABO/RH(D): O POS
Antibody Screen: NEGATIVE

## 2023-01-07 LAB — ABO/RH: ABO/RH(D): O POS

## 2023-01-07 LAB — GLUCOSE, CAPILLARY: Glucose-Capillary: 150 mg/dL — ABNORMAL HIGH (ref 70–99)

## 2023-01-07 SURGERY — PROSTATECTOMY, RADICAL, ROBOT-ASSISTED, LAPAROSCOPIC
Anesthesia: General | Site: Pelvis

## 2023-01-07 MED ORDER — MAGNESIUM CITRATE PO SOLN: 1.0000 | Freq: Once | ORAL | Status: DC

## 2023-01-07 MED ORDER — LACTATED RINGERS IV SOLN: INTRAVENOUS | Status: DC

## 2023-01-07 MED ORDER — HYDROMORPHONE HCL 1 MG/ML IJ SOLN
0.2500 mg | INTRAMUSCULAR | Status: DC | PRN
  Administered 2023-01-07 (×3): 0.25 mg via INTRAVENOUS

## 2023-01-07 MED ORDER — FENTANYL CITRATE PF 50 MCG/ML IJ SOSY
PREFILLED_SYRINGE | INTRAMUSCULAR | Status: AC
  Administered 2023-01-07: 50 ug via INTRAVENOUS
  Filled 2023-01-07: qty 3

## 2023-01-07 MED ORDER — MIDAZOLAM HCL 5 MG/5ML IJ SOLN
INTRAMUSCULAR | Status: DC | PRN
  Administered 2023-01-07: 2 mg via INTRAVENOUS

## 2023-01-07 MED ORDER — ONDANSETRON HCL 4 MG/2ML IJ SOLN
INTRAMUSCULAR | Status: DC | PRN
  Administered 2023-01-07: 4 mg via INTRAVENOUS

## 2023-01-07 MED ORDER — LIDOCAINE HCL (PF) 2 % IJ SOLN
INTRAMUSCULAR | Status: AC
  Filled 2023-01-07: qty 10

## 2023-01-07 MED ORDER — HYDROMORPHONE HCL 1 MG/ML IJ SOLN
0.5000 mg | INTRAMUSCULAR | Status: DC | PRN
  Administered 2023-01-07: 1 mg via INTRAVENOUS
  Filled 2023-01-07: qty 1

## 2023-01-07 MED ORDER — CHLORHEXIDINE GLUCONATE 0.12 % MT SOLN
15.0000 mL | Freq: Once | OROMUCOSAL | Status: AC
  Administered 2023-01-07: 15 mL via OROMUCOSAL

## 2023-01-07 MED ORDER — ROCURONIUM BROMIDE 100 MG/10ML IV SOLN
INTRAVENOUS | Status: DC | PRN
  Administered 2023-01-07 (×2): 20 mg via INTRAVENOUS
  Administered 2023-01-07: 60 mg via INTRAVENOUS

## 2023-01-07 MED ORDER — SODIUM CHLORIDE (PF) 0.9 % IJ SOLN
INTRAMUSCULAR | Status: DC | PRN
  Administered 2023-01-07: 20 mL

## 2023-01-07 MED ORDER — SUCCINYLCHOLINE CHLORIDE 200 MG/10ML IV SOSY
PREFILLED_SYRINGE | INTRAVENOUS | Status: AC
  Filled 2023-01-07: qty 10

## 2023-01-07 MED ORDER — PROPOFOL 10 MG/ML IV BOLUS
INTRAVENOUS | Status: AC
  Filled 2023-01-07: qty 20

## 2023-01-07 MED ORDER — BUPIVACAINE LIPOSOME 1.3 % IJ SUSP
INTRAMUSCULAR | Status: AC
  Filled 2023-01-07: qty 20

## 2023-01-07 MED ORDER — SENNOSIDES-DOCUSATE SODIUM 8.6-50 MG PO TABS
2.0000 | ORAL_TABLET | Freq: Every day | ORAL | Status: DC
  Administered 2023-01-07: 2 via ORAL
  Filled 2023-01-07: qty 2

## 2023-01-07 MED ORDER — ONDANSETRON HCL 4 MG/2ML IJ SOLN
INTRAMUSCULAR | Status: AC
  Filled 2023-01-07: qty 4

## 2023-01-07 MED ORDER — LIDOCAINE HCL (CARDIAC) PF 100 MG/5ML IV SOSY
PREFILLED_SYRINGE | INTRAVENOUS | Status: DC | PRN
  Administered 2023-01-07: 40 mg via INTRAVENOUS

## 2023-01-07 MED ORDER — HYDROCODONE-ACETAMINOPHEN 5-325 MG PO TABS: 1.0000 | ORAL_TABLET | Freq: Four times a day (QID) | ORAL | 0 refills | Status: DC | PRN

## 2023-01-07 MED ORDER — ROCURONIUM BROMIDE 10 MG/ML (PF) SYRINGE
PREFILLED_SYRINGE | INTRAVENOUS | Status: AC
  Filled 2023-01-07: qty 20

## 2023-01-07 MED ORDER — DEXMEDETOMIDINE HCL IN NACL 80 MCG/20ML IV SOLN
INTRAVENOUS | Status: DC | PRN
  Administered 2023-01-07: 8 ug via BUCCAL

## 2023-01-07 MED ORDER — PANTOPRAZOLE SODIUM 40 MG PO TBEC: 40.0000 mg | DELAYED_RELEASE_TABLET | Freq: Every day | ORAL | Status: DC

## 2023-01-07 MED ORDER — PHENYLEPHRINE 80 MCG/ML (10ML) SYRINGE FOR IV PUSH (FOR BLOOD PRESSURE SUPPORT)
PREFILLED_SYRINGE | INTRAVENOUS | Status: AC
  Filled 2023-01-07: qty 20

## 2023-01-07 MED ORDER — FENTANYL CITRATE (PF) 100 MCG/2ML IJ SOLN
INTRAMUSCULAR | Status: DC | PRN
  Administered 2023-01-07: 100 ug via INTRAVENOUS
  Administered 2023-01-07: 50 ug via INTRAVENOUS
  Administered 2023-01-07: 100 ug via INTRAVENOUS

## 2023-01-07 MED ORDER — OXYCODONE HCL 5 MG PO TABS
5.0000 mg | ORAL_TABLET | ORAL | Status: DC | PRN
  Administered 2023-01-08: 5 mg via ORAL
  Filled 2023-01-07: qty 1

## 2023-01-07 MED ORDER — PROPOFOL 10 MG/ML IV BOLUS
INTRAVENOUS | Status: DC | PRN
  Administered 2023-01-07: 100 mg via INTRAVENOUS

## 2023-01-07 MED ORDER — SUGAMMADEX SODIUM 500 MG/5ML IV SOLN
INTRAVENOUS | Status: DC | PRN
  Administered 2023-01-07: 400 mg via INTRAVENOUS

## 2023-01-07 MED ORDER — KETOROLAC TROMETHAMINE 15 MG/ML IJ SOLN
15.0000 mg | Freq: Four times a day (QID) | INTRAMUSCULAR | Status: DC
  Administered 2023-01-07 – 2023-01-08 (×3): 15 mg via INTRAVENOUS
  Filled 2023-01-07 (×3): qty 1

## 2023-01-07 MED ORDER — FENTANYL CITRATE (PF) 250 MCG/5ML IJ SOLN
INTRAMUSCULAR | Status: AC
  Filled 2023-01-07: qty 5

## 2023-01-07 MED ORDER — LORATADINE 10 MG PO TABS: 10.0000 mg | ORAL_TABLET | Freq: Every day | ORAL | Status: DC

## 2023-01-07 MED ORDER — BUPIVACAINE LIPOSOME 1.3 % IJ SUSP
INTRAMUSCULAR | Status: DC | PRN
  Administered 2023-01-07: 20 mL

## 2023-01-07 MED ORDER — HYDROMORPHONE HCL 1 MG/ML IJ SOLN
INTRAMUSCULAR | Status: AC
  Administered 2023-01-07: 0.25 mg via INTRAVENOUS
  Filled 2023-01-07: qty 1

## 2023-01-07 MED ORDER — FENTANYL CITRATE PF 50 MCG/ML IJ SOSY
25.0000 ug | PREFILLED_SYRINGE | INTRAMUSCULAR | Status: DC | PRN
  Administered 2023-01-07 (×2): 50 ug via INTRAVENOUS

## 2023-01-07 MED ORDER — PHENYLEPHRINE HCL (PRESSORS) 10 MG/ML IV SOLN
INTRAVENOUS | Status: DC | PRN
  Administered 2023-01-07: 160 ug via INTRAVENOUS

## 2023-01-07 MED ORDER — CEFAZOLIN SODIUM-DEXTROSE 2-4 GM/100ML-% IV SOLN
2.0000 g | INTRAVENOUS | Status: AC
  Administered 2023-01-07: 2 g via INTRAVENOUS
  Filled 2023-01-07: qty 100

## 2023-01-07 MED ORDER — SULFAMETHOXAZOLE-TRIMETHOPRIM 800-160 MG PO TABS: 1.0000 | ORAL_TABLET | Freq: Two times a day (BID) | ORAL | 0 refills | Status: DC

## 2023-01-07 MED ORDER — ACETAMINOPHEN 500 MG PO TABS
1000.0000 mg | ORAL_TABLET | Freq: Once | ORAL | Status: DC
  Filled 2023-01-07: qty 2

## 2023-01-07 MED ORDER — DEXAMETHASONE SODIUM PHOSPHATE 10 MG/ML IJ SOLN
INTRAMUSCULAR | Status: AC
  Filled 2023-01-07: qty 2

## 2023-01-07 MED ORDER — ONDANSETRON HCL 4 MG/2ML IJ SOLN
4.0000 mg | INTRAMUSCULAR | Status: DC | PRN
  Administered 2023-01-07: 4 mg via INTRAVENOUS
  Filled 2023-01-07 (×2): qty 2

## 2023-01-07 MED ORDER — STERILE WATER FOR IRRIGATION IR SOLN
Status: DC | PRN
  Administered 2023-01-07: 1000 mL

## 2023-01-07 MED ORDER — ROSUVASTATIN CALCIUM 5 MG PO TABS: 5.0000 mg | ORAL_TABLET | Freq: Every day | ORAL | Status: DC

## 2023-01-07 MED ORDER — EPHEDRINE 5 MG/ML INJ
INTRAVENOUS | Status: AC
  Filled 2023-01-07: qty 5

## 2023-01-07 MED ORDER — LACTATED RINGERS IR SOLN
Status: DC | PRN
  Administered 2023-01-07: 1000 mL

## 2023-01-07 MED ORDER — DOCUSATE SODIUM 100 MG PO CAPS: 100.0000 mg | ORAL_CAPSULE | Freq: Two times a day (BID) | ORAL | Status: DC

## 2023-01-07 MED ORDER — DEXAMETHASONE SODIUM PHOSPHATE 10 MG/ML IJ SOLN
INTRAMUSCULAR | Status: DC | PRN
  Administered 2023-01-07: 8 mg via INTRAVENOUS

## 2023-01-07 MED ORDER — SODIUM CHLORIDE 0.9 % IV SOLN
INTRAVENOUS | Status: DC
  Administered 2023-01-07: 1000 mL via INTRAVENOUS

## 2023-01-07 MED ORDER — ACETAMINOPHEN 500 MG PO TABS
1000.0000 mg | ORAL_TABLET | Freq: Four times a day (QID) | ORAL | Status: DC
  Administered 2023-01-07 – 2023-01-08 (×3): 1000 mg via ORAL
  Filled 2023-01-07 (×3): qty 2

## 2023-01-07 MED ORDER — MIDAZOLAM HCL 2 MG/2ML IJ SOLN
INTRAMUSCULAR | Status: AC
  Filled 2023-01-07: qty 2

## 2023-01-07 MED ORDER — HYOSCYAMINE SULFATE 0.125 MG SL SUBL: 0.1250 mg | SUBLINGUAL_TABLET | SUBLINGUAL | Status: DC | PRN

## 2023-01-07 MED ORDER — SODIUM CHLORIDE (PF) 0.9 % IJ SOLN
INTRAMUSCULAR | Status: AC
  Filled 2023-01-07: qty 20

## 2023-01-07 MED ORDER — ORAL CARE MOUTH RINSE: 15.0000 mL | Freq: Once | OROMUCOSAL | Status: AC

## 2023-01-07 SURGICAL SUPPLY — 71 items
APPLICATOR COTTON TIP 6 STRL (MISCELLANEOUS) ×2 IMPLANT
APPLICATOR COTTON TIP 6IN STRL (MISCELLANEOUS) ×2
BAG COUNTER SPONGE SURGICOUNT (BAG) IMPLANT
CATH FOLEY 2WAY SLVR 18FR 30CC (CATHETERS) ×2 IMPLANT
CATH TIEMANN FOLEY 18FR 5CC (CATHETERS) ×2 IMPLANT
CHLORAPREP W/TINT 26 (MISCELLANEOUS) ×2 IMPLANT
CLIP LIGATING HEM O LOK PURPLE (MISCELLANEOUS) ×8 IMPLANT
CNTNR URN SCR LID CUP LEK RST (MISCELLANEOUS) ×2 IMPLANT
CONT SPEC 4OZ STRL OR WHT (MISCELLANEOUS) ×2
COVER SURGICAL LIGHT HANDLE (MISCELLANEOUS) ×2 IMPLANT
COVER TIP SHEARS 8 DVNC (MISCELLANEOUS) ×2 IMPLANT
COVER TIP SHEARS 8MM DA VINCI (MISCELLANEOUS) ×2
CUTTER ECHEON FLEX ENDO 45 340 (ENDOMECHANICALS) ×2 IMPLANT
DERMABOND ADVANCED .7 DNX12 (GAUZE/BANDAGES/DRESSINGS) ×2 IMPLANT
DRAIN CHANNEL RND F F (WOUND CARE) IMPLANT
DRAPE ARM DVNC X/XI (DISPOSABLE) ×8 IMPLANT
DRAPE COLUMN DVNC XI (DISPOSABLE) ×2 IMPLANT
DRAPE DA VINCI XI ARM (DISPOSABLE) ×8
DRAPE DA VINCI XI COLUMN (DISPOSABLE) ×2
DRAPE SURG IRRIG POUCH 19X23 (DRAPES) ×2 IMPLANT
DRSG TEGADERM 4X4.75 (GAUZE/BANDAGES/DRESSINGS) ×2 IMPLANT
ELECT PENCIL ROCKER SW 15FT (MISCELLANEOUS) ×2 IMPLANT
ELECT REM PT RETURN 15FT ADLT (MISCELLANEOUS) ×2 IMPLANT
GAUZE 4X4 16PLY ~~LOC~~+RFID DBL (SPONGE) IMPLANT
GAUZE SPONGE 2X2 STRL 8-PLY (GAUZE/BANDAGES/DRESSINGS) IMPLANT
GAUZE SPONGE 4X4 12PLY STRL (GAUZE/BANDAGES/DRESSINGS) ×2 IMPLANT
GLOVE BIO SURGEON STRL SZ 6.5 (GLOVE) ×2 IMPLANT
GLOVE BIOGEL PI IND STRL 7.5 (GLOVE) ×2 IMPLANT
GLOVE SURG LX STRL 7.5 STRW (GLOVE) ×4 IMPLANT
GOWN SRG XL LVL 4 BRTHBL STRL (GOWNS) ×2 IMPLANT
GOWN STRL NON-REIN XL LVL4 (GOWNS) ×2
GOWN STRL REUS W/ TWL XL LVL3 (GOWN DISPOSABLE) ×4 IMPLANT
GOWN STRL REUS W/TWL XL LVL3 (GOWN DISPOSABLE) ×4
HOLDER FOLEY CATH W/STRAP (MISCELLANEOUS) ×2 IMPLANT
IRRIG SUCT STRYKERFLOW 2 WTIP (MISCELLANEOUS) ×2
IRRIGATION SUCT STRKRFLW 2 WTP (MISCELLANEOUS) ×2 IMPLANT
IV LACTATED RINGERS 1000ML (IV SOLUTION) ×2 IMPLANT
KIT PROCEDURE DA VINCI SI (MISCELLANEOUS) ×4
KIT PROCEDURE DVNC SI (MISCELLANEOUS) ×2 IMPLANT
KIT TURNOVER KIT A (KITS) IMPLANT
NDL INSUFFLATION 14GA 120MM (NEEDLE) ×2 IMPLANT
NDL SPNL 22GX7 QUINCKE BK (NEEDLE) ×2 IMPLANT
NEEDLE INSUFFLATION 14GA 120MM (NEEDLE) ×2 IMPLANT
NEEDLE SPNL 22GX7 QUINCKE BK (NEEDLE) ×2 IMPLANT
PACK ROBOT UROLOGY CUSTOM (CUSTOM PROCEDURE TRAY) ×2 IMPLANT
PAD POSITIONING PINK XL (MISCELLANEOUS) ×2 IMPLANT
PORT ACCESS TROCAR AIRSEAL 12 (TROCAR) ×2 IMPLANT
RELOAD STAPLE 45 4.1 GRN THCK (STAPLE) ×2 IMPLANT
SEAL CANN UNIV 5-8 DVNC XI (MISCELLANEOUS) ×8 IMPLANT
SEAL XI 5MM-8MM UNIVERSAL (MISCELLANEOUS) ×8
SET TRI-LUMEN FLTR TB AIRSEAL (TUBING) ×2 IMPLANT
SOL ELECTROSURG ANTI STICK (MISCELLANEOUS) ×2
SOLUTION ELECTROSURG ANTI STCK (MISCELLANEOUS) ×2 IMPLANT
SPIKE FLUID TRANSFER (MISCELLANEOUS) ×2 IMPLANT
SPONGE T-LAP 4X18 ~~LOC~~+RFID (SPONGE) ×2 IMPLANT
STAPLE RELOAD 45 GRN (STAPLE) ×2 IMPLANT
STAPLE RELOAD 45MM GREEN (STAPLE) ×2
SUT ETHILON 3 0 PS 1 (SUTURE) ×2 IMPLANT
SUT MNCRL AB 4-0 PS2 18 (SUTURE) ×4 IMPLANT
SUT PDS AB 1 CT1 27 (SUTURE) ×4 IMPLANT
SUT VIC AB 2-0 SH 27 (SUTURE) ×2
SUT VIC AB 2-0 SH 27X BRD (SUTURE) ×2 IMPLANT
SUT VIC AB 3-0 SH 27 (SUTURE) ×2
SUT VIC AB 3-0 SH 27XBRD (SUTURE) IMPLANT
SUT VICRYL 0 UR6 27IN ABS (SUTURE) ×2 IMPLANT
SUT VLOC BARB 180 ABS3/0GR12 (SUTURE) ×6
SUTURE VLOC BRB 180 ABS3/0GR12 (SUTURE) ×6 IMPLANT
SYR 27GX1/2 1ML LL SAFETY (SYRINGE) ×2 IMPLANT
TOWEL OR NON WOVEN STRL DISP B (DISPOSABLE) ×2 IMPLANT
TROCAR Z-THREAD FIOS 5X100MM (TROCAR) IMPLANT
WATER STERILE IRR 1000ML POUR (IV SOLUTION) ×2 IMPLANT

## 2023-01-07 NOTE — Anesthesia Preprocedure Evaluation (Addendum)
Anesthesia Evaluation  Patient identified by MRN, date of birth, ID band Patient awake    Reviewed: Allergy & Precautions, NPO status , Patient's Chart, lab work & pertinent test results  Airway Mallampati: II  TM Distance: >3 FB Neck ROM: Full    Dental  (+) Dental Advisory Given, Missing,    Pulmonary neg pulmonary ROS   Pulmonary exam normal breath sounds clear to auscultation       Cardiovascular negative cardio ROS Normal cardiovascular exam Rhythm:Regular Rate:Normal     Neuro/Psych negative neurological ROS  negative psych ROS   GI/Hepatic Neg liver ROS,GERD  ,,  Endo/Other  negative endocrine ROS    Renal/GU negative Renal ROS  negative genitourinary   Musculoskeletal  (+) Arthritis ,    Abdominal   Peds  Hematology negative hematology ROS (+)   Anesthesia Other Findings   Reproductive/Obstetrics                             Anesthesia Physical Anesthesia Plan  ASA: 2  Anesthesia Plan: General   Post-op Pain Management: Ketamine IV* and Ofirmev IV (intra-op)*   Induction: Intravenous  PONV Risk Score and Plan: 2 and Dexamethasone, Ondansetron and Treatment may vary due to age or medical condition  Airway Management Planned: Oral ETT  Additional Equipment:   Intra-op Plan:   Post-operative Plan: Extubation in OR  Informed Consent: I have reviewed the patients History and Physical, chart, labs and discussed the procedure including the risks, benefits and alternatives for the proposed anesthesia with the patient or authorized representative who has indicated his/her understanding and acceptance.     Dental advisory given  Plan Discussed with: CRNA  Anesthesia Plan Comments: (2 IVs)       Anesthesia Quick Evaluation

## 2023-01-07 NOTE — H&P (Signed)
Trevor Wilson. is an 72 y.o. male.    Chief Complaint: Pre-Op Prostatectomy / Node Dissection  HPI:   1 - Lower Urinary Tract Symptoms - years of bother from urinary urgency / frequency. Nocturia x 3-4 (age-normal). Still bothored on BID tamsulosin. DRE 07/2018 50gm smooth. PVR 07/2018 "11m". UA normal. Oxybutynin trial 07/2018 but no real benefit. On alpha blockers only BID.   2 - Moderate Risk Prostate Cancer - 4/12 cores up to 20% Grade 1 cancer 10/2020 on eval PSA 4.6, TRUS BX 46 mL, No median.   REcent Surveilance Course:  04/2021 - PSA 5.38, DRE 45gm smooth; 10/2021 PSA 5.94 MRI 34gm with L lat apex 128mP4 lesion.  04/2022 - PSA 6.12, DRE 45gm smooth;  10/2022 - PSA 7.98, MRI fusion + template BX 49 mL, no median ==> 7/14 cores up to 50% mix of grade 1-3 cancer.   PMH sig for GERD. No ischemic CV disease / blood thinners. He is retired [id number] to be close to grandchildren. He is father in law of Trevor Wilson. His PCP is Trevor Koirala MD.   Today " Trevor Wilson is seen to proceed with prostatectomy / node dissection. No interval fevers. Hgb 13.1.   Past Medical History:  Diagnosis Date  . Arthritis   . ED (erectile dysfunction) due to arterial insufficiency 05/05/2022   11/05/2021  . GERD (gastroesophageal reflux disease)   . Prostate cancer ([facility]12/05/2022   05/05/2022, 11/05/2021  . Urinary urgency 05/05/2022   11/05/2021    Past Surgical History:  Procedure Laterality Date  . BACK SURGERY    . Surgical pathology, Gross and Microscopic examination for prosate needle  10/23/2022    No family history on file. Social History:  reports that he has never smoked. He has never used smokeless tobacco. He reports that he does not currently use alcohol. He reports that he does not use drugs.  Allergies: No Known Allergies  No medications prior to admission.    No results found for this or any previous visit (from the past  48 hour(s)). No results found.  Review of Systems  Constitutional:  Negative for chills and fever.  All other systems reviewed and are negative.  There were no vitals taken for this visit. Physical Exam Vitals reviewed.  HENT:     Head: Normocephalic.  Eyes:     Pupils: Pupils are equal, round, and reactive to light.  Cardiovascular:     Rate and Rhythm: Normal rate.  Pulmonary:     Effort: Pulmonary effort is normal.  Abdominal:     General: Abdomen is flat.  Genitourinary:    Comments: No CVAT at present Musculoskeletal:        General: Normal range of motion.     Cervical back: Normal range of motion.  Neurological:     General: No focal deficit present.     Mental Status: He is alert.  Psychiatric:        Mood and Affect: Mood normal.     Assessment/Plan  Proceed as planned with prostatectomy / node dissection. Risks, benefits, alternatives, expected peri-op course discussed previously and reiterated today.   ThAlexis FrockMD 01/07/2023, 7:33 AM

## 2023-01-07 NOTE — Discharge Instructions (Addendum)

## 2023-01-07 NOTE — Transfer of Care (Signed)
Immediate Anesthesia Transfer of Care Note  Patient: Trevor Wilson.  Procedure(s) Performed: XI ROBOTIC ASSISTED LAPAROSCOPIC RADICAL PROSTATECTOMY AND INDOCYANINE GREEN DYE INJECTION (Pelvis) PELVIC LYMPH NODE DISSECTION (Bilateral: Pelvis)  Patient Location: PACU  Anesthesia Type:General  Level of Consciousness: awake, alert , oriented, and patient cooperative  Airway & Oxygen Therapy: Patient Spontanous Breathing and Patient connected to face mask oxygen  Post-op Assessment: Report given to RN, Post -op Vital signs reviewed and stable, and Patient moving all extremities X 4  Post vital signs: Reviewed and stable  Last Vitals:  Vitals Value Taken Time  BP 133/106   Temp    Pulse 75 01/07/23 1610  Resp 17 01/07/23 1610  SpO2 100 % 01/07/23 1610  Vitals shown include unvalidated device data.  Last Pain:  Vitals:   01/07/23 1131  TempSrc:   PainSc: 0-No pain         Complications: No notable events documented.

## 2023-01-07 NOTE — Anesthesia Procedure Notes (Signed)
Procedure Name: Intubation Date/Time: 01/07/2023 1:47 PM  Performed by: Jonna Munro, CRNAPre-anesthesia Checklist: Patient identified, Emergency Drugs available, Suction available, Patient being monitored and Timeout performed Patient Re-evaluated:Patient Re-evaluated prior to induction Oxygen Delivery Method: Circle system utilized Preoxygenation: Pre-oxygenation with 100% oxygen Induction Type: IV induction Laryngoscope Size: Mac and 4 Grade View: Grade I Tube type: Oral Tube size: 7.5 mm Number of attempts: 1 Airway Equipment and Method: Stylet Placement Confirmation: ETT inserted through vocal cords under direct vision, positive ETCO2, CO2 detector and breath sounds checked- equal and bilateral Secured at: 22 cm Tube secured with: Tape Dental Injury: Teeth and Oropharynx as per pre-operative assessment

## 2023-01-07 NOTE — Brief Op Note (Signed)
01/07/2023  3:59 PM  PATIENT:  Trevor Wilson.  72 y.o. male  PRE-OPERATIVE DIAGNOSIS:  PROSTATE CANCER  POST-OPERATIVE DIAGNOSIS:  Prostate cancer  PROCEDURE:  Procedure(s) with comments: XI ROBOTIC ASSISTED LAPAROSCOPIC RADICAL PROSTATECTOMY AND INDOCYANINE GREEN DYE INJECTION (N/A) - 3 HRS PELVIC LYMPH NODE DISSECTION (Bilateral)  SURGEON:  Surgeon(s) and Role:    Alexis Frock, MD - Primary  PHYSICIAN ASSISTANT:   ASSISTANTS: Clemetine Marker PA   ANESTHESIA:   local and general  EBL:  171m   BLOOD ADMINISTERED:none  DRAINS:  1- JP to bulb; 2- Foley to gravity    LOCAL MEDICATIONS USED:  MARCAINE     SPECIMEN:  Source of Specimen:  1 - prostatectomy; 2- pelfic lymph nodes; 2 - bladder neck margin  DISPOSITION OF SPECIMEN:  PATHOLOGY  COUNTS:  YES  TOURNIQUET:  * No tourniquets in log *  DICTATION: .Other Dictation: Dictation Number [id number] PLAN OF CARE: Admit for overnight observation  PATIENT DISPOSITION:  PACU - hemodynamically stable.   Delay start of Pharmacological VTE agent (>24hrs) due to surgical blood loss or risk of bleeding: yes

## 2023-01-07 NOTE — Op Note (Unsigned)
NAME: Trevor Wilson, BOULET MEDICAL RECORD NO: [id number] ACCOUNT NO: [id number] DATE OF BIRTH: 02-Jul-1951 FACILITY: [facility] LOCATION: [id number] PHYSICIAN: Alexis Frock, MD  Operative Report   DATE OF PROCEDURE: 01/07/2023  PREOPERATIVE DIAGNOSIS:  Moderate risk prostate cancer, progressing on surveillance.  POSTOPERATIVE DIAGNOSIS:  Moderate risk prostate cancer, progressing on surveillance.  PROCEDURES:   1.  Robotic-assisted laparoscopic radical prostatectomy. 2.  Bilateral pelvic lymph node dissection.  ESTIMATED BLOOD LOSS:  123XX123 mL  COMPLICATIONS:  None.  SPECIMEN:   1.  Right external iliac lymph nodes. 2.  Right obturator lymph nodes. 3.  Left external iliac lymph nodes. 4.  Left obturator lymph nodes. 5.  Radical prostatectomy. 6.  Revised bladder neck margin. 7.  Final bladder neck margin.  All further pathology.  BLOOD LOSS:  100 mL  ASSISTANT:  Debbrah Alar, PA.  DRAINS:   1.  Jackson-Pratt drain to bulb suction. 2.  Foley catheter to straight drain.  FINDINGS:   1.  Excellent parenchymal uptake with ICG dye for sentinel lymphangiography. 2.  No evidence of sentinel lymph nodes within the pelvic lymph node fields. 3.  Small lip of prostate extending into the bladder neck area.  Bladder neck margin revised.  INDICATIONS:  The patient is a very pleasant 72 year old man who was found to have low risk adenocarcinoma of the prostate, several years ago.  He elected surveillance, has been very compliant with this.  He was found on surveillance to have increasing  volume and grade of disease.  There was a moderate risk adenocarcinoma.  He does have some baseline irritative and obstructive voiding symptoms and a prostate volume approximately 50 grams.  He has done incredibly good functional status and is very  vigorous for his age.  Options discussed for management again including surveillance protocols versus ablative therapy versus surgical extirpation and he wished to  proceed with surgical extirpation with curative intent.  Informed consent was obtained and  placed in medical record.  PROCEDURE IN DETAIL:  The patient being identified and verified, procedure being radical prostatectomy was confirmed.  Procedure timeout was performed.  Intravenous antibiotics were administered.  General endotracheal anesthesia induced.  The patient was  placed into a low lithotomy position.  Sterile field was created, prepped and draped the patient's penis, perineum, and proximal thighs using iodine and his infra-xiphoid abdomen using chlorhexidine gluconate.  He was further fashioned the operative  table using 3-inch tape with foam padding across supraxiphoid chest.  His arms were tucked to the side with gel rolls.  Test of steep Trendelenburg positioning was performed and he was found to be suitably positioned.  Foley catheter was placed per  urethra to straight drain.  Next, a high flow, low pressure, pneumoperitoneum was obtained using Veress technique in the supraumbilical midline having passed the aspiration and drop test.  An 8 mm robotic camera port was then placed in the location.   Laparoscopic examination peritoneal cavity shows no significant adhesions, no visceral injury.  Additional ports were placed as follows:  Right paramedian 8 mm robotic port, right far lateral 12 mm AirSeal assist port, right paramedian 5 mm suction port,  left paramedian 8 mm robotic port, left far lateral 8 mm robotic port.  Robot was docked and passed the electronic checks.  Initial attention was directed at development of space of Retzius.  Incision made lateral to medial umbilical ligament from the  area of the midline towards the area of the internal ring coursing along the iliac vessels towards  the area of the right ureter. The right vas deferens was encountered, ligated using medial bucket handle and the right bladder wall swept away from pelvic  sidewall towards the area of the endopelvic  fascia on the right side.  A mirror image dissection was performed on the left side.  Anterior attachments were taken down with cautery scissors to expose the anterior base of the prostate, which was defatted  to better denote the bladder neck prostate junction with this fat set aside labeled as periprostatic fat.  Next, the endopelvic fascia was carefully swept away from the lateral aspect of the prostate in base to apex orientation.  This exposed the area of  the dorsal venous complex.  Next, 0.2 mL of indocyanine green dye was injected into each lobe of the prostate using a percutaneously placed robotically guided spinal needle with intervening suctioning to prevent dye spillage, which did not occur.  Next,  the dorsal venous complex was carefully controlled using green load stapler, taking exquisite care to avoid membranous urethral injury, which did not occur.  Sentinel lymphangiography revealed excellent parenchymal uptake of the prostate and multiple  lymphatic channels seen coursing from it.  However, there were no obvious sentinel lymph nodes within the pelvic lymph node field.  With careful inspection of the internal iliac, external iliac and obturator groups respectively.  As such, standard  template lymphadenectomy was performed first on the right side of the right external iliac group with the boundaries being right external iliac artery, vein, pelvic sidewall, iliac bifurcation.  Lymphostasis achieved with cold clips set aside labeled as  right external lymph nodes.  Next, right obturator group was dissected free with the boundaries being right external iliac vein, pelvic sidewall, obturator nerve.  Lymphostasis was achieved with cold clips set aside labeled as right obturator lymph  nodes.  The right obturator nerve was inspected following maneuvers and found to be uninjured.  A mirror image lymphadenectomy was performed of the left side, left external iliac, left obturator group respectively  and similarly left obturator nerve was  uninjured.  Attention was then directed at bladder neck dissection.  Bladder neck was identified moving the Foley catheter back and forth and a lateral release was performed on each side to better denote the bladder neck prostate junction and the bladder  neck was carefully separated from the prostate in anterior and posterior direction, keeping what appeared to be a rim of circular muscle fibers each plane of dissection.  His preoperative ultrasound have been reviewed prior to surgery, and there was no  obvious significant median lobe noted.  However, in the area of the posterior bladder neck there was some tissue was concerning for possible prostate in the area of the bladder neck as such, a revised bladder neck margin was taken and a final margin as  well, which appeared to be circular muscle fibers and not residual prostate tissue, I was quite happy with this dissection.  Next, posterior dissection was performed by incising approximately 7 mm inferior posterior portion of the prostate entering the  plane of Denonvilliers.  Bilateral vas deferens were encountered, dissected for a distance of approximately 4 cm, ligated and placed on gentle superior traction.  Bilateral seminal vesicles were dissected to their tip and placed on gentle superior  traction.  Dissection proceeded within the plane of Denonvilliers inferior to the area of the apex of the prostate.  This exposed the vascular pedicles, which were controlled using a sequential clipping technique in a base  to apex orientation performing  moderate nerve sparing bilaterally.  Final apical dissection was performed in the anterior plane, transecting the membranous urethra coldly.  This completely freed up the prostatectomy specimen was placed into an EndoCatch bag for later retrieval.  Next,  digital rectal exam was performed using indicator glove under laparoscopic vision and no evidence of rectal violation was  noted.  Posterior dissection was performed using 3-0 V-Loc suture, reapproximating the posterior urethral plate. The posterior  bladder neck, bringing the structures into tension-free apposition.  Mucosa-to-mucosa anastomosis was performed using double-arm 3-0 V-Loc suture from the 6 o'clock, 12 o'clock position, which resulted in excellent tension-free reapposition.  A new Foley  catheter was placed per urethra, which irrigated quantitatively.  Sponge and needle counts were correct.  Hemostasis was excellent.  We achieved the goals of the surgery today.  A closed suction drain was brought through the previous left lateral most  robotic port site into the peritoneal cavity.  Robot was then undocked.  The previous right lateral most assistant port site was closed at the level of the fascia using Carter-Thomason suture passer and 0 Vicryl.  Specimen was retrieved by extending the  camera port site superiorly for a total distance of approximately 3.5 cm, removing the prostatectomy specimen setting aside for permanent pathology.  Extraction site was closed at the level of the fascia using figure-of-eight PDS x4 followed by  reapproximation of Scarpa's with running Vicryl.  All incision sites were infiltrated with dilute lipolyzed Marcaine and closed at the level of the skin using subcuticular Monocryl followed by Dermabond.  Procedure was then terminated.  The patient  tolerated procedure well, no immediate perioperative complications.  The patient taken to postanesthesia care in stable condition.  Plan for observation admission.  Please note, first assistant, Debbrah Alar, was crucial for all portions of surgery today.  She provided invaluable retraction, suctioning, vascular clipping, vascular stapling, robotic instrument exchange and general first assistance.   PUS D: 01/07/2023 4:07:50 pm T: 01/07/2023 5:57:00 pm  JOB: [id number]

## 2023-01-08 ENCOUNTER — Encounter (HOSPITAL_COMMUNITY): Payer: Self-pay | Admitting: Urology

## 2023-01-08 DIAGNOSIS — C61 Malignant neoplasm of prostate: Secondary | ICD-10-CM | POA: Diagnosis not present

## 2023-01-08 LAB — BASIC METABOLIC PANEL
Anion gap: 8 (ref 5–15)
BUN: 19 mg/dL (ref 8–23)
CO2: 22 mmol/L (ref 22–32)
Calcium: 8.6 mg/dL — ABNORMAL LOW (ref 8.9–10.3)
Chloride: 102 mmol/L (ref 98–111)
Creatinine, Ser: 0.92 mg/dL (ref 0.61–1.24)
GFR, Estimated: >60 mL/min (ref >60)
Glucose, Bld: 111 mg/dL — ABNORMAL HIGH (ref 70–99)
Potassium: 4.4 mmol/L (ref 3.5–5.1)
Sodium: 132 mmol/L — ABNORMAL LOW (ref 135–145)

## 2023-01-08 LAB — HEMOGLOBIN AND HEMATOCRIT, BLOOD
HCT: 32.2 % — ABNORMAL LOW (ref 39.0–52.0)
Hemoglobin: 11.1 g/dL — ABNORMAL LOW (ref 13.0–17.0)

## 2023-01-08 NOTE — Progress Notes (Signed)
  Transition of Care [facility]) Screening Note   Patient Details  Name: Trevor Wilson. Date of Birth: 02-10-1951   Transition of Care [facility]) CM/SW Contact:    Dessa Phi, RN Phone Number: 01/08/2023, 9:37 AM    Transition of Care Department [facility]) has reviewed patient and no TOC needs have been identified at this time. We will continue to monitor patient advancement through interdisciplinary progression rounds. If new patient transition needs arise, please place a TOC consult.

## 2023-01-08 NOTE — Progress Notes (Signed)
Attempted to walk pt this morning. While pt was standing, foley catheter noted leaking. On call MD made aware and writer was advise to wait till MD rounds this morning.

## 2023-01-08 NOTE — Anesthesia Postprocedure Evaluation (Signed)
Anesthesia Post Note  Patient: Trevor Wilson.  Procedure(s) Performed: XI ROBOTIC ASSISTED LAPAROSCOPIC RADICAL PROSTATECTOMY AND INDOCYANINE GREEN DYE INJECTION (Pelvis) PELVIC LYMPH NODE DISSECTION (Bilateral: Pelvis)     Patient location during evaluation: PACU Anesthesia Type: General Level of consciousness: awake and alert Pain management: pain level controlled Vital Signs Assessment: post-procedure vital signs reviewed and stable Respiratory status: spontaneous breathing, nonlabored ventilation, respiratory function stable and patient connected to nasal cannula oxygen Cardiovascular status: blood pressure returned to baseline and stable Postop Assessment: no apparent nausea or vomiting Anesthetic complications: no  No notable events documented.  Last Vitals:  Vitals:   01/08/23 0116 01/08/23 0425  BP: 123/72 129/80  Pulse: 80 81  Resp: 20 20  Temp: 37.1 C 36.8 C  SpO2: 97% 96%    Last Pain:  Vitals:   01/08/23 0747  TempSrc:   PainSc: 0-No pain                 Rehan Holness L Denisa Enterline

## 2023-01-08 NOTE — Progress Notes (Signed)
Mobility Specialist - Progress Note   01/08/23 0944  Mobility  Activity Ambulated independently in hallway  Level of Assistance Independent  Assistive Device None  Distance Ambulated (ft) 1000 ft  Range of Motion/Exercises Active  Activity Response Tolerated well  Mobility Referral Yes  $Mobility charge 1 Mobility   Pt was found in bed and agreeable to ambulate. Had no complaints during session and at EOS returned to bed with all necessities in reach. Wife in room and RN notified of some leaking during ambulation from catheter.   Ferd Hibbs Mobility Specialist

## 2023-01-08 NOTE — Discharge Summary (Signed)
[facility] Discharge Summary  Admit date: 01/07/2023  Discharge date and time: 01/08/23   Discharge to: Home  Discharge Service: Urology  Discharge Attending Physician:  Dr. Tresa Moore  Discharge  Diagnoses: Prostate cancer [facility])  Secondary Diagnosis: Principal Problem:   Prostate cancer ([place])   OR Procedures: Procedure(s): XI ROBOTIC ASSISTED LAPAROSCOPIC RADICAL PROSTATECTOMY AND INDOCYANINE GREEN DYE INJECTION PELVIC LYMPH NODE DISSECTION 01/07/2023   Ancillary Procedures: None   Discharge Day Services: The patient was seen and examined by the Urology team both in the morning and immediately prior to discharge.  Vital signs and laboratory values were stable and within normal limits.  The physical exam was benign and unchanged and all surgical wounds were examined.  Discharge instructions were explained and all questions answered.  Subjective  No acute events overnight. Pain Controlled. No fever or chills.  Objective Patient Vitals for the past 8 hrs:  BP Temp Temp src Pulse Resp SpO2  01/08/23 0425 129/80 98.2 F (36.8 C) Oral 81 20 96 %   Total I/O In: 240 [P.O.:240] Out: 1110 [Urine:1050; Drains:60]  General Appearance:        No acute distress Lungs:                       Normal work of breathing on room air Heart:                                Regular rate and rhythm Abdomen:                         Soft, non-tender, non-distended. Incisions clean/dry/intact w/o erythema GU:        Foley draining thin pink urine Extremities:                      Warm and well perfused   Hospital Course:  The patient underwent robotic assisted radical prostatectomy with bilateral pelvic lymph node dissection on 01/07/2023.  The patient tolerated the procedure well, was extubated in the OR, and afterwards was taken to the PACU for routine post-surgical care. When stable the patient was transferred to the floor.   The patient did well postoperatively.  The patient's diet was slowly  advanced and at the time of discharge was tolerating a regular diet.  The patient was discharged home 1 Day Post-Op, at which point was tolerating a regular solid diet, have adequate pain control with P.O. pain medication, and could ambulate without difficulty. The patient will follow up with [place] for post op check and catheter removal.  Condition at Discharge: Improved  Discharge Medications:  Allergies as of 01/08/2023   No Known Allergies      Medication List     STOP taking these medications    Centrum Silver 50+Men Tabs   tamsulosin 0.4 MG Caps capsule Commonly known as: FLOMAX       TAKE these medications    acetaminophen 325 MG tablet Commonly known as: TYLENOL Take 650 mg by mouth every 6 (six) hours as needed for moderate pain.   cetirizine 10 MG tablet Commonly known as: ZYRTEC Take 10 mg by mouth daily.   diclofenac 75 MG EC tablet Commonly known as: VOLTAREN Take 75 mg by mouth daily.   docusate sodium 100 MG capsule Commonly known as: COLACE Take 1 capsule (100 mg total) by mouth 2 (two) times daily.  HYDROcodone-acetaminophen 5-325 MG tablet Commonly known as: Norco Take 1-2 tablets by mouth every 6 (six) hours as needed for moderate pain or severe pain.   omeprazole 40 MG capsule Commonly known as: PRILOSEC Take 40 mg by mouth every morning.   rosuvastatin 5 MG tablet Commonly known as: CRESTOR Take 5 mg by mouth daily.   sulfamethoxazole-trimethoprim 800-160 MG tablet Commonly known as: BACTRIM DS Take 1 tablet by mouth 2 (two) times daily. Start the day prior to foley removal appointment   tadalafil 10 MG tablet Commonly known as: CIALIS Take 10 mg by mouth daily as needed for erectile dysfunction.

## 2023-01-13 LAB — SURGICAL PATHOLOGY

## 2023-01-26 ENCOUNTER — Emergency Department (HOSPITAL_BASED_OUTPATIENT_CLINIC_OR_DEPARTMENT_OTHER): Payer: PPO

## 2023-01-26 ENCOUNTER — Inpatient Hospital Stay (HOSPITAL_COMMUNITY): Payer: PPO

## 2023-01-26 ENCOUNTER — Inpatient Hospital Stay (HOSPITAL_BASED_OUTPATIENT_CLINIC_OR_DEPARTMENT_OTHER)
Admission: EM | Admit: 2023-01-26 | Discharge: 2023-01-30 | DRG: 871 | Disposition: A | Discharge status: Home Health | Payer: PPO | Attending: Internal Medicine | Admitting: Internal Medicine

## 2023-01-26 ENCOUNTER — Encounter (HOSPITAL_BASED_OUTPATIENT_CLINIC_OR_DEPARTMENT_OTHER): Payer: Self-pay | Admitting: *Deleted

## 2023-01-26 ENCOUNTER — Other Ambulatory Visit: Payer: Self-pay

## 2023-01-26 DIAGNOSIS — E8779 Other fluid overload: Secondary | ICD-10-CM | POA: Diagnosis not present

## 2023-01-26 DIAGNOSIS — D500 Iron deficiency anemia secondary to blood loss (chronic): Secondary | ICD-10-CM | POA: Diagnosis present

## 2023-01-26 DIAGNOSIS — Z9079 Acquired absence of other genital organ(s): Secondary | ICD-10-CM | POA: Diagnosis not present

## 2023-01-26 DIAGNOSIS — I700 Atherosclerosis of aorta: Secondary | ICD-10-CM | POA: Diagnosis present

## 2023-01-26 DIAGNOSIS — Z8546 Personal history of malignant neoplasm of prostate: Secondary | ICD-10-CM

## 2023-01-26 DIAGNOSIS — I21A1 Myocardial infarction type 2: Secondary | ICD-10-CM | POA: Diagnosis present

## 2023-01-26 DIAGNOSIS — R7401 Elevation of levels of liver transaminase levels: Secondary | ICD-10-CM | POA: Diagnosis not present

## 2023-01-26 DIAGNOSIS — I214 Non-ST elevation (NSTEMI) myocardial infarction: Secondary | ICD-10-CM | POA: Diagnosis not present

## 2023-01-26 DIAGNOSIS — M1990 Unspecified osteoarthritis, unspecified site: Secondary | ICD-10-CM | POA: Diagnosis present

## 2023-01-26 DIAGNOSIS — Z1612 Extended spectrum beta lactamase (ESBL) resistance: Secondary | ICD-10-CM | POA: Diagnosis present

## 2023-01-26 DIAGNOSIS — B9629 Other Escherichia coli [E. coli] as the cause of diseases classified elsewhere: Secondary | ICD-10-CM | POA: Diagnosis not present

## 2023-01-26 DIAGNOSIS — Z8249 Family history of ischemic heart disease and other diseases of the circulatory system: Secondary | ICD-10-CM | POA: Diagnosis not present

## 2023-01-26 DIAGNOSIS — T503X5A Adverse effect of electrolytic, caloric and water-balance agents, initial encounter: Secondary | ICD-10-CM | POA: Diagnosis not present

## 2023-01-26 DIAGNOSIS — N10 Acute tubulo-interstitial nephritis: Secondary | ICD-10-CM | POA: Diagnosis present

## 2023-01-26 DIAGNOSIS — Z1152 Encounter for screening for COVID-19: Secondary | ICD-10-CM

## 2023-01-26 DIAGNOSIS — I2489 Other forms of acute ischemic heart disease: Secondary | ICD-10-CM

## 2023-01-26 DIAGNOSIS — A4151 Sepsis due to Escherichia coli [E. coli]: Principal | ICD-10-CM | POA: Diagnosis present

## 2023-01-26 DIAGNOSIS — E871 Hypo-osmolality and hyponatremia: Secondary | ICD-10-CM | POA: Diagnosis present

## 2023-01-26 DIAGNOSIS — D649 Anemia, unspecified: Secondary | ICD-10-CM

## 2023-01-26 DIAGNOSIS — D62 Acute posthemorrhagic anemia: Secondary | ICD-10-CM

## 2023-01-26 DIAGNOSIS — E8809 Other disorders of plasma-protein metabolism, not elsewhere classified: Secondary | ICD-10-CM | POA: Diagnosis present

## 2023-01-26 DIAGNOSIS — A498 Other bacterial infections of unspecified site: Secondary | ICD-10-CM | POA: Diagnosis not present

## 2023-01-26 DIAGNOSIS — N12 Tubulo-interstitial nephritis, not specified as acute or chronic: Secondary | ICD-10-CM | POA: Diagnosis present

## 2023-01-26 DIAGNOSIS — Z79899 Other long term (current) drug therapy: Secondary | ICD-10-CM | POA: Diagnosis not present

## 2023-01-26 DIAGNOSIS — R6521 Severe sepsis with septic shock: Secondary | ICD-10-CM | POA: Diagnosis present

## 2023-01-26 DIAGNOSIS — N390 Urinary tract infection, site not specified: Secondary | ICD-10-CM | POA: Diagnosis not present

## 2023-01-26 DIAGNOSIS — R0902 Hypoxemia: Secondary | ICD-10-CM | POA: Diagnosis not present

## 2023-01-26 DIAGNOSIS — K219 Gastro-esophageal reflux disease without esophagitis: Secondary | ICD-10-CM | POA: Diagnosis present

## 2023-01-26 DIAGNOSIS — E8770 Fluid overload, unspecified: Secondary | ICD-10-CM

## 2023-01-26 DIAGNOSIS — E785 Hyperlipidemia, unspecified: Secondary | ICD-10-CM | POA: Diagnosis present

## 2023-01-26 DIAGNOSIS — R6520 Severe sepsis without septic shock: Secondary | ICD-10-CM | POA: Diagnosis present

## 2023-01-26 DIAGNOSIS — B9620 Unspecified Escherichia coli [E. coli] as the cause of diseases classified elsewhere: Secondary | ICD-10-CM | POA: Diagnosis not present

## 2023-01-26 DIAGNOSIS — A419 Sepsis, unspecified organism: Secondary | ICD-10-CM | POA: Diagnosis present

## 2023-01-26 DIAGNOSIS — I5031 Acute diastolic (congestive) heart failure: Secondary | ICD-10-CM | POA: Diagnosis not present

## 2023-01-26 LAB — URINALYSIS, ROUTINE W REFLEX MICROSCOPIC
Bilirubin Urine: NEGATIVE
Glucose, UA: NEGATIVE mg/dL
Ketones, ur: 15 mg/dL — AB
Nitrite: NEGATIVE
Protein, ur: 30 mg/dL — AB
Specific Gravity, Urine: 1.028 (ref 1.005–1.030)
WBC, UA: >50 WBC/hpf (ref 0–5)
pH: 6.5 (ref 5.0–8.0)

## 2023-01-26 LAB — RETICULOCYTES
Immature Retic Fract: 7.8 % (ref 2.3–15.9)
RBC.: 2.79 MIL/uL — ABNORMAL LOW (ref 4.22–5.81)
Retic Count, Absolute: 27.3 10*3/uL (ref 19.0–186.0)
Retic Ct Pct: 1 % (ref 0.4–3.1)

## 2023-01-26 LAB — CBC WITH DIFFERENTIAL/PLATELET
Abs Immature Granulocytes: 0 K/uL (ref 0.00–0.07)
Abs Immature Granulocytes: 0.05 10*3/uL (ref 0.00–0.07)
Basophils Absolute: 0 K/uL (ref 0.0–0.1)
Basophils Absolute: 0 10*3/uL (ref 0.0–0.1)
Basophils Relative: 1 %
Basophils Relative: 0 %
Eosinophils Absolute: 0 K/uL (ref 0.0–0.5)
Eosinophils Absolute: 0 10*3/uL (ref 0.0–0.5)
Eosinophils Relative: 0 %
Eosinophils Relative: 0 %
HCT: 29.2 % — ABNORMAL LOW (ref 39.0–52.0)
HCT: 27.5 % — ABNORMAL LOW (ref 39.0–52.0)
Hemoglobin: 10.3 g/dL — ABNORMAL LOW (ref 13.0–17.0)
Hemoglobin: 9.4 g/dL — ABNORMAL LOW (ref 13.0–17.0)
Immature Granulocytes: 0 %
Immature Granulocytes: 0 %
Lymphocytes Relative: 9 %
Lymphocytes Relative: 2 %
Lymphs Abs: 0.4 K/uL — ABNORMAL LOW (ref 0.7–4.0)
Lymphs Abs: 0.2 10*3/uL — ABNORMAL LOW (ref 0.7–4.0)
MCH: 33 pg (ref 26.0–34.0)
MCH: 33 pg (ref 26.0–34.0)
MCHC: 35.3 g/dL (ref 30.0–36.0)
MCHC: 34.2 g/dL (ref 30.0–36.0)
MCV: 93.6 fL (ref 80.0–100.0)
MCV: 96.5 fL (ref 80.0–100.0)
Monocytes Absolute: 0 K/uL — ABNORMAL LOW (ref 0.1–1.0)
Monocytes Absolute: 0.4 10*3/uL (ref 0.1–1.0)
Monocytes Relative: 1 %
Monocytes Relative: 3 %
Neutro Abs: 3.8 K/uL (ref 1.7–7.7)
Neutro Abs: 12.2 10*3/uL — ABNORMAL HIGH (ref 1.7–7.7)
Neutrophils Relative %: 89 %
Neutrophils Relative %: 95 %
Platelets: 189 K/uL (ref 150–400)
Platelets: 159 10*3/uL (ref 150–400)
RBC: 3.12 MIL/uL — ABNORMAL LOW (ref 4.22–5.81)
RBC: 2.85 MIL/uL — ABNORMAL LOW (ref 4.22–5.81)
RDW: 11.8 % (ref 11.5–15.5)
RDW: 11.9 % (ref 11.5–15.5)
WBC: 4.3 K/uL (ref 4.0–10.5)
WBC: 12.9 10*3/uL — ABNORMAL HIGH (ref 4.0–10.5)
nRBC: 0 % (ref 0.0–0.2)
nRBC: 0 % (ref 0.0–0.2)

## 2023-01-26 LAB — COMPREHENSIVE METABOLIC PANEL
ALT: 109 U/L — ABNORMAL HIGH (ref 0–44)
AST: 83 U/L — ABNORMAL HIGH (ref 15–41)
Albumin: 2.9 g/dL — ABNORMAL LOW (ref 3.5–5.0)
Alkaline Phosphatase: 175 U/L — ABNORMAL HIGH (ref 38–126)
Anion gap: 12 (ref 5–15)
BUN: 16 mg/dL (ref 8–23)
CO2: 22 mmol/L (ref 22–32)
Calcium: 8.1 mg/dL — ABNORMAL LOW (ref 8.9–10.3)
Chloride: 97 mmol/L — ABNORMAL LOW (ref 98–111)
Creatinine, Ser: 0.75 mg/dL (ref 0.61–1.24)
GFR, Estimated: >60 mL/min (ref >60)
Glucose, Bld: 119 mg/dL — ABNORMAL HIGH (ref 70–99)
Potassium: 3.8 mmol/L (ref 3.5–5.1)
Sodium: 131 mmol/L — ABNORMAL LOW (ref 135–145)
Total Bilirubin: 1.1 mg/dL (ref 0.3–1.2)
Total Protein: 6 g/dL — ABNORMAL LOW (ref 6.5–8.1)

## 2023-01-26 LAB — LACTIC ACID, PLASMA
Lactic Acid, Venous: 4.6 mmol/L (ref 0.5–1.9)
Lactic Acid, Venous: 3.3 mmol/L (ref 0.5–1.9)
Lactic Acid, Venous: 1.5 mmol/L (ref 0.5–1.9)
Lactic Acid, Venous: 1.6 mmol/L (ref 0.5–1.9)
Lactic Acid, Venous: 1.1 mmol/L (ref 0.5–1.9)

## 2023-01-26 LAB — IRON AND TIBC
Iron: 9 ug/dL — ABNORMAL LOW (ref 45–182)
Saturation Ratios: 5 % — ABNORMAL LOW (ref 17.9–39.5)
TIBC: 192 ug/dL — ABNORMAL LOW (ref 250–450)
UIBC: 183 ug/dL

## 2023-01-26 LAB — FOLATE: Folate: 22.6 ng/mL (ref >5.9)

## 2023-01-26 LAB — LIPID PANEL
Cholesterol: 82 mg/dL (ref 0–200)
HDL: 37 mg/dL — ABNORMAL LOW
LDL Cholesterol: 36 mg/dL (ref 0–99)
Total CHOL/HDL Ratio: 2.2 ratio
Triglycerides: 45 mg/dL
VLDL: 9 mg/dL (ref 0–40)

## 2023-01-26 LAB — PROCALCITONIN: Procalcitonin: 27.03 ng/mL

## 2023-01-26 LAB — COMPREHENSIVE METABOLIC PANEL WITH GFR
ALT: 131 U/L — ABNORMAL HIGH (ref 0–44)
AST: 95 U/L — ABNORMAL HIGH (ref 15–41)
Albumin: 3.5 g/dL (ref 3.5–5.0)
Alkaline Phosphatase: 172 U/L — ABNORMAL HIGH (ref 38–126)
Anion gap: 15 (ref 5–15)
BUN: 19 mg/dL (ref 8–23)
CO2: 17 mmol/L — ABNORMAL LOW (ref 22–32)
Calcium: 8.7 mg/dL — ABNORMAL LOW (ref 8.9–10.3)
Chloride: 98 mmol/L (ref 98–111)
Creatinine, Ser: 0.91 mg/dL (ref 0.61–1.24)
GFR, Estimated: >60 mL/min (ref >60)
Glucose, Bld: 136 mg/dL — ABNORMAL HIGH (ref 70–99)
Potassium: 4.5 mmol/L (ref 3.5–5.1)
Sodium: 130 mmol/L — ABNORMAL LOW (ref 135–145)
Total Bilirubin: 0.8 mg/dL (ref 0.3–1.2)
Total Protein: 6.7 g/dL (ref 6.5–8.1)

## 2023-01-26 LAB — RESP PANEL BY RT-PCR (RSV, FLU A&B, COVID)  RVPGX2
Influenza A by PCR: NEGATIVE
Influenza B by PCR: NEGATIVE
Resp Syncytial Virus by PCR: NEGATIVE
SARS Coronavirus 2 by RT PCR: NEGATIVE

## 2023-01-26 LAB — TROPONIN I (HIGH SENSITIVITY)
Troponin I (High Sensitivity): 1432 ng/L
Troponin I (High Sensitivity): 1522 ng/L
Troponin I (High Sensitivity): 1438 ng/L (ref <18)
Troponin I (High Sensitivity): 1591 ng/L (ref <18)

## 2023-01-26 LAB — FERRITIN: Ferritin: 366 ng/mL — ABNORMAL HIGH (ref 24–336)

## 2023-01-26 LAB — TSH: TSH: 1.071 u[IU]/mL (ref 0.350–4.500)

## 2023-01-26 LAB — MRSA NEXT GEN BY PCR, NASAL: MRSA by PCR Next Gen: NOT DETECTED

## 2023-01-26 LAB — BRAIN NATRIURETIC PEPTIDE
B Natriuretic Peptide: 269.7 pg/mL — ABNORMAL HIGH (ref 0.0–100.0)
B Natriuretic Peptide: 849.4 pg/mL — ABNORMAL HIGH (ref 0.0–100.0)

## 2023-01-26 LAB — APTT: aPTT: 39 seconds — ABNORMAL HIGH (ref 24–36)

## 2023-01-26 LAB — HIV ANTIBODY (ROUTINE TESTING W REFLEX): HIV Screen 4th Generation wRfx: NONREACTIVE

## 2023-01-26 LAB — PROTIME-INR
INR: 1.4 — ABNORMAL HIGH (ref 0.8–1.2)
Prothrombin Time: 16.9 seconds — ABNORMAL HIGH (ref 11.4–15.2)

## 2023-01-26 LAB — VITAMIN B12: Vitamin B-12: 1018 pg/mL — ABNORMAL HIGH (ref 180–914)

## 2023-01-26 MED ORDER — LACTATED RINGERS IV SOLN: INTRAVENOUS | Status: DC

## 2023-01-26 MED ORDER — SODIUM CHLORIDE 0.9 % IV SOLN
2.0000 g | Freq: Once | INTRAVENOUS | Status: AC
  Administered 2023-01-26: 2 g via INTRAVENOUS
  Filled 2023-01-26: qty 12.5

## 2023-01-26 MED ORDER — ONDANSETRON HCL 4 MG PO TABS: 4.0000 mg | ORAL_TABLET | Freq: Four times a day (QID) | ORAL | Status: DC | PRN

## 2023-01-26 MED ORDER — VANCOMYCIN HCL IN DEXTROSE 1-5 GM/200ML-% IV SOLN: 1000.0000 mg | Freq: Two times a day (BID) | INTRAVENOUS | Status: DC

## 2023-01-26 MED ORDER — ASPIRIN 300 MG RE SUPP: 300.0000 mg | RECTAL | Status: AC

## 2023-01-26 MED ORDER — VANCOMYCIN HCL IN DEXTROSE 1-5 GM/200ML-% IV SOLN
1000.0000 mg | Freq: Two times a day (BID) | INTRAVENOUS | Status: DC
  Administered 2023-01-26: 1000 mg via INTRAVENOUS
  Filled 2023-01-26: qty 200

## 2023-01-26 MED ORDER — METRONIDAZOLE 500 MG/100ML IV SOLN
500.0000 mg | Freq: Once | INTRAVENOUS | Status: AC
  Administered 2023-01-26: 500 mg via INTRAVENOUS
  Filled 2023-01-26: qty 100

## 2023-01-26 MED ORDER — SODIUM CHLORIDE 0.9 % IV SOLN
2.0000 g | Freq: Three times a day (TID) | INTRAVENOUS | Status: DC
  Administered 2023-01-26 (×2): 2 g via INTRAVENOUS
  Filled 2023-01-26 (×2): qty 12.5

## 2023-01-26 MED ORDER — IBUPROFEN 800 MG PO TABS
800.0000 mg | ORAL_TABLET | Freq: Once | ORAL | Status: AC
  Administered 2023-01-26: 800 mg via ORAL
  Filled 2023-01-26: qty 1

## 2023-01-26 MED ORDER — HEPARIN (PORCINE) 25000 UT/250ML-% IV SOLN
1350.0000 [IU]/h | INTRAVENOUS | Status: DC
  Administered 2023-01-26: 950 [IU]/h via INTRAVENOUS
  Administered 2023-01-27 – 2023-01-28 (×3): 1350 [IU]/h via INTRAVENOUS
  Filled 2023-01-26 (×4): qty 250

## 2023-01-26 MED ORDER — LACTATED RINGERS IV BOLUS
1000.0000 mL | Freq: Once | INTRAVENOUS | Status: AC
  Administered 2023-01-26: 1000 mL via INTRAVENOUS

## 2023-01-26 MED ORDER — ACETAMINOPHEN 325 MG PO TABS
650.0000 mg | ORAL_TABLET | Freq: Once | ORAL | Status: AC
  Administered 2023-01-26: 650 mg via ORAL
  Filled 2023-01-26: qty 2

## 2023-01-26 MED ORDER — ASPIRIN 81 MG PO TBEC
81.0000 mg | DELAYED_RELEASE_TABLET | Freq: Every day | ORAL | Status: DC
  Administered 2023-01-27 – 2023-01-30 (×4): 81 mg via ORAL
  Filled 2023-01-26 (×4): qty 1

## 2023-01-26 MED ORDER — LACTATED RINGERS IV BOLUS (SEPSIS)
500.0000 mL | Freq: Once | INTRAVENOUS | Status: AC
  Administered 2023-01-26: 500 mL via INTRAVENOUS

## 2023-01-26 MED ORDER — MAGNESIUM HYDROXIDE 400 MG/5ML PO SUSP
30.0000 mL | Freq: Every day | ORAL | Status: DC | PRN
  Administered 2023-01-26 – 2023-01-29 (×2): 30 mL via ORAL
  Filled 2023-01-26 (×2): qty 30

## 2023-01-26 MED ORDER — ALBUTEROL SULFATE (2.5 MG/3ML) 0.083% IN NEBU: 2.5000 mg | INHALATION_SOLUTION | RESPIRATORY_TRACT | Status: DC | PRN

## 2023-01-26 MED ORDER — CHLORHEXIDINE GLUCONATE CLOTH 2 % EX PADS
6.0000 | MEDICATED_PAD | Freq: Every day | CUTANEOUS | Status: DC
  Administered 2023-01-26 – 2023-01-29 (×3): 6 via TOPICAL
  Filled 2023-01-26: qty 6

## 2023-01-26 MED ORDER — VANCOMYCIN HCL IN DEXTROSE 1-5 GM/200ML-% IV SOLN
1000.0000 mg | Freq: Once | INTRAVENOUS | Status: AC
  Administered 2023-01-26: 1000 mg via INTRAVENOUS
  Filled 2023-01-26: qty 200

## 2023-01-26 MED ORDER — ASPIRIN 81 MG PO CHEW: 324.0000 mg | CHEWABLE_TABLET | ORAL | Status: AC

## 2023-01-26 MED ORDER — IOHEXOL 350 MG/ML SOLN
100.0000 mL | Freq: Once | INTRAVENOUS | Status: AC | PRN
  Administered 2023-01-26: 80 mL via INTRAVENOUS

## 2023-01-26 MED ORDER — SODIUM CHLORIDE 0.9 % IV SOLN: INTRAVENOUS | Status: DC

## 2023-01-26 MED ORDER — ACETAMINOPHEN 325 MG PO TABS
650.0000 mg | ORAL_TABLET | Freq: Four times a day (QID) | ORAL | Status: DC | PRN
  Administered 2023-01-26 – 2023-01-30 (×13): 650 mg via ORAL
  Filled 2023-01-26 (×14): qty 2

## 2023-01-26 MED ORDER — IOHEXOL 300 MG/ML  SOLN
100.0000 mL | Freq: Once | INTRAMUSCULAR | Status: AC | PRN
  Administered 2023-01-26: 100 mL via INTRAVENOUS

## 2023-01-26 MED ORDER — LACTATED RINGERS IV BOLUS (SEPSIS)
1000.0000 mL | Freq: Once | INTRAVENOUS | Status: AC
  Administered 2023-01-26: 1000 mL via INTRAVENOUS

## 2023-01-26 MED ORDER — ASPIRIN 325 MG PO TABS
325.0000 mg | ORAL_TABLET | Freq: Once | ORAL | Status: AC
  Administered 2023-01-26: 325 mg via ORAL
  Filled 2023-01-26: qty 1

## 2023-01-26 MED ORDER — ACETAMINOPHEN 650 MG RE SUPP: 650.0000 mg | Freq: Four times a day (QID) | RECTAL | Status: DC | PRN

## 2023-01-26 MED ORDER — NITROGLYCERIN 0.4 MG SL SUBL: 0.4000 mg | SUBLINGUAL_TABLET | SUBLINGUAL | Status: DC | PRN

## 2023-01-26 MED ORDER — HEPARIN SODIUM (PORCINE) 5000 UNIT/ML IJ SOLN: 5000.0000 [IU] | Freq: Three times a day (TID) | INTRAMUSCULAR | Status: DC

## 2023-01-26 MED ORDER — HEPARIN BOLUS VIA INFUSION
4000.0000 [IU] | Freq: Once | INTRAVENOUS | Status: AC
  Administered 2023-01-26: 4000 [IU] via INTRAVENOUS
  Filled 2023-01-26: qty 4000

## 2023-01-26 MED ORDER — ONDANSETRON HCL 4 MG/2ML IJ SOLN: 4.0000 mg | Freq: Four times a day (QID) | INTRAMUSCULAR | Status: DC | PRN

## 2023-01-26 NOTE — ED Notes (Signed)
-  Called [facility] for transportation to [place], will be after shift change.

## 2023-01-26 NOTE — Progress Notes (Signed)
  PT with vague SOB/DOE prior to current acute illness, family hx of CAD  Hx of HLD.  In setting of sepsis due to UTI  ce 1431  2nd 1522 .  Discussed with cardiology Chandrasekhar who recommend heparinization and will f/u with patient in am for evaluation of NSTEMI ?type II r/o Type 1.  -ASA given  - Echo , lipid/ serial CE ordered  - morphine/nitro prn, of note patient has no chest pain  -will to be complete f/u with [facility] in setting of recent post op status

## 2023-01-26 NOTE — ED Triage Notes (Addendum)
C/o feeling sob that is worse tonight. Fevers unknown. Recent history of surgery for prostrate cancer. Sats 97% on RA. No known sick contacts. Temp 101. Last dose of tylenol was at 2300 Pt did have a foley post procedure. Denies any urinary symptoms. Denies a cough. Has had chills over the past couple days.

## 2023-01-26 NOTE — ED Notes (Signed)
MD at bedside. 

## 2023-01-26 NOTE — Progress Notes (Signed)
Patient developed flushed skin on face. Alerted pharmacist who says they will adjust the rate the the Vancomycin infuses at.

## 2023-01-26 NOTE — ED Provider Notes (Signed)
[facility]  Provider Note  CSN: [id number] Arrival date & time: 01/26/23 0331  History Chief Complaint  Patient presents with   Shortness of Breath    Trevor Wilson. is a 72 y.o. male brought to the ED by family for evaluation of fever and SOB. He is approx 3 weeks s/p radical prostatectomy for prostate cancer. Had been doing well but in the last 3 days had intermittent episodes of shaking chills/rigors at home, no documented fever. He has had some abdominal soreness. Tonight the shaking woke him up and he was feeling SOB due to the shaking. He is not SOB now that the shaking has stopped. He has not had any cough, congestion, N/V/D or dysuria. No longer has catheter in place. He denies any rashes or tick bites.    Home Medications Prior to Admission medications   Medication Sig Start Date End Date Taking? Authorizing Provider  acetaminophen (TYLENOL) 325 MG tablet Take 650 mg by mouth every 6 (six) hours as needed for moderate pain.    [provider]  cetirizine (ZYRTEC) 10 MG tablet Take 10 mg by mouth daily.    [provider]  diclofenac (VOLTAREN) 75 MG EC tablet Take 75 mg by mouth daily. 12/03/22   [provider]  docusate sodium (COLACE) 100 MG capsule Take 1 capsule (100 mg total) by mouth 2 (two) times daily. 01/07/23   Debbrah Alar, PA-C  HYDROcodone-acetaminophen (NORCO) 5-325 MG tablet Take 1-2 tablets by mouth every 6 (six) hours as needed for moderate pain or severe pain. 01/07/23   Debbrah Alar, PA-C  omeprazole (PRILOSEC) 40 MG capsule Take 40 mg by mouth every morning. 09/02/22   [provider]  rosuvastatin (CRESTOR) 5 MG tablet Take 5 mg by mouth daily. 09/15/22   [provider]     Allergies    Patient has no known allergies.   Review of Systems   Review of Systems Please see HPI for pertinent positives and negatives  Physical Exam BP 105/66   Pulse 93    Temp 99.3 F (37.4 C) (Oral)   Resp (!) 25   Wt 79.4 kg   SpO2 93%   BMI 25.11 kg/m   Physical Exam Vitals and nursing note reviewed.  Constitutional:      Appearance: Normal appearance.  HENT:     Head: Normocephalic and atraumatic.     Nose: Nose normal.     Mouth/Throat:     Mouth: Mucous membranes are moist.  Eyes:     Extraocular Movements: Extraocular movements intact.     Conjunctiva/sclera: Conjunctivae normal.  Cardiovascular:     Rate and Rhythm: Tachycardia present.  Pulmonary:     Effort: Pulmonary effort is normal.     Breath sounds: Normal breath sounds.  Abdominal:     General: Abdomen is flat.     Palpations: Abdomen is soft.     Tenderness: There is abdominal tenderness (mild, diffuse worse over RLQ and suprabuic area). There is no guarding.  Musculoskeletal:        General: No swelling. Normal range of motion.     Cervical back: Neck supple.  Skin:    General: Skin is warm and dry.  Neurological:     General: No focal deficit present.     Mental Status: He is alert.  Psychiatric:        Mood and Affect: Mood normal.     ED Results / Procedures /  Treatments   EKG EKG Interpretation  Date/Time:  Monday January 26 2023 03:40:48 EDT Ventricular Rate:  102 PR Interval:  170 QRS Duration: 88 QT Interval:  316 QTC Calculation: 412 R Axis:   -1 Text Interpretation: Sinus tachycardia Low voltage, precordial leads Abnormal R-wave progression, early transition No old tracing to compare Confirmed by Calvert Cantor [phone]) on 01/26/2023 3:42:01 AM  Procedures Procedures  Medications Ordered in the ED Medications  lactated ringers infusion ( Intravenous New Bag/Given 01/26/23 0458)  lactated ringers bolus 1,000 mL (1,000 mLs Intravenous New Bag/Given 01/26/23 0535)    And  lactated ringers bolus 500 mL (has no administration in time range)  lactated ringers bolus 1,000 mL (0 mLs Intravenous Stopped 01/26/23 0600)  acetaminophen (TYLENOL) tablet 650 mg  (650 mg Oral Given 01/26/23 0407)  iohexol (OMNIPAQUE) 300 MG/ML solution 100 mL (100 mLs Intravenous Contrast Given 01/26/23 0448)  ceFEPIme (MAXIPIME) 2 g in sodium chloride 0.9 % 100 mL IVPB (0 g Intravenous Stopped 01/26/23 0528)  metroNIDAZOLE (FLAGYL) IVPB 500 mg (0 mg Intravenous Stopped 01/26/23 0559)  vancomycin (VANCOCIN) IVPB 1000 mg/200 mL premix (1,000 mg Intravenous New Bag/Given 01/26/23 0535)  ibuprofen (ADVIL) tablet 800 mg (800 mg Oral Given 01/26/23 [id number])    Initial Impression and Plan  Patient here with symptoms concerning for rigors/bacteremia. He was found to be febrile and tachycardic on arrival. BP is normal. No clear etiology by history. Will check labs, CXR and send for CT to evaluate post-op infection. IVF, APAP for fever/tachycardia. Currently in no distress.   ED Course   Clinical Course as of 01/26/23 0637  Mon Jan 26, 2023  0414 CBC with normal WBC, Hgb is low but similar to previous.  [CS]  [id number] I personally viewed the images from radiology studies and agree with radiologist interpretation: CXR is clear [CS]  0442 Lactic acid is elevated. Will give 30cc/kg LR bolus and begin broad spectum Abx. CMP with normal Cr, LFTs mildly elevated of unclear significance.  [CS]  0451 Covid/Flu/RSV swab is neg. [CS]  [id number] UA is concerning for cystitis as likely source of his fever. Plan admission for continued antibiotics.  [CS]  [id number] I personally viewed the images from radiology studies and agree with radiologist interpretation: CT consistent with pyelonephritis. Hospitalists consulted for admission.  [CS]  0627 Lactic acid is improving.  [CS]  0636 Spoke with Dr. Myna Hidalgo, Hospitalist, who will accept for admission.  [CS]    Clinical Course User Index [CS] Truddie Hidden, MD     MDM Rules/Calculators/A&P Medical Decision Making Problems Addressed: Pyelonephritis: acute illness or injury that poses a threat to life or bodily functions Sepsis without acute organ  dysfunction, due to unspecified organism [facility]): acute illness or injury that poses a threat to life or bodily functions  Amount and/or Complexity of Data Reviewed Labs: ordered. Decision-making details documented in ED Course. Radiology: ordered and independent interpretation performed. Decision-making details documented in ED Course. ECG/medicine tests: ordered and independent interpretation performed. Decision-making details documented in ED Course.  Risk OTC drugs. Prescription drug management. Decision regarding hospitalization.  Critical Care Total time providing critical care: 60 minutes     Final Clinical Impression(s) / ED Diagnoses Final diagnoses:  Pyelonephritis  Sepsis without acute organ dysfunction, due to unspecified organism [facility])    Rx / DC Orders ED Discharge Orders     None        Truddie Hidden, MD 01/26/23 [phone]

## 2023-01-26 NOTE — ED Notes (Signed)
Patient transported to CT 

## 2023-01-26 NOTE — Progress Notes (Addendum)
Plan of Care Note for accepted transfer   Patient: Trevor Wilson. MRN: [id number]   DOA: 01/26/2023  Facility requesting transfer: [facility]   Requesting Provider: Dr. Karle Starch   Reason for transfer: Sepsis d/t UTI   Facility course: 72 yr old man who underwent radical prostatectomy on 01/07/23 and now presents with rigors and dyspnea.   He is febrile with mild tachycardia and tachypnea, stable BP. Labs notable for sodium 130, AST 95, ALT 131, and lactate of 4.6 then 3.3. CT findings suggest acute pyelonephritis.   Blood and urine cultures were obtained and pt was treated with 30 cc/kg LR, vancomycin, cefepime, and Flagyl in the ED.   Plan of care: The patient is accepted for admission to stepdown unit, at [facility].   Author: Vianne Bulls, MD 01/26/2023  Check [email or web address] for on-call coverage.  Nursing staff, Please call [place] number on Amion as soon as patient's arrival, so appropriate admitting provider can evaluate the pt.

## 2023-01-26 NOTE — ED Notes (Signed)
Pt in bed, pt states that he has no needs before transport, resps even and unlabored, pt sinus rhythm on monitor, care link at bedside, pt from dpt.  Called rn Shanon Brow to update about pt, pt from dpt.

## 2023-01-26 NOTE — Plan of Care (Addendum)
Discussed with patient plan of care for the evening, pain management and mobility with some teach back displayed.  Problem: Education: Goal: Knowledge of General Education information will improve Description: Including pain rating scale, medication(s)/side effects and non-pharmacologic comfort measures Outcome: Progressing   Problem: Health Behavior/Discharge Planning: Goal: Ability to manage health-related needs will improve Outcome: Progressing

## 2023-01-26 NOTE — Progress Notes (Signed)
Pt being followed by [facility] for Sepsis protocol. 

## 2023-01-26 NOTE — ED Notes (Signed)
Report given to Shanon Brow at [facility] .

## 2023-01-26 NOTE — Progress Notes (Signed)
Critcial troponin at 12:00 communicated to MD with orders received. Subsequent elevated troponins were consistent with original value and did not need to be called. Patient currently denying any symptoms associated with critical value. Will continue to monitor.

## 2023-01-26 NOTE — H&P (Signed)
History and Physical    Trevor Wilson. [id number] DOB: [birth year] DOA: 01/26/2023  PCP: Lujean Amel, MD  Patient coming from: [facility]     I have personally briefly reviewed patient's old medical records in [place]  Chief Complaint:sob, fever, chills s/p prostatectomy  HPI: Trevor Wilson. is a 72 y.o. male with medical history significant of arthritis, GERD, history of  Grade 1 prostate cancer diagnosed 10/2020 s/p TRUS BX with progression to grade 1-3 12/23 as well as LUTS despite use of BID flomax. Patient has interim history of prostatectomy and node dissection for above diagnosis on 2/24.  Patient  presents to [facility] 3/11 with complaints of sob, fever/chills and rigors x intermittently x 3 days. Patient on presentation to ED noted sob insetting of high fever and rigors. He denies , any cough, chest pain , uri sxs. He also noted no dysuria, diarrhea or abdominal pain.  He did however note nausea with one episode of emesis.He denies any sick contacts. He  notes s/p his surgery he felt quite well and s/p d/c of urinary catheter was able to return to his normal activities such as hiking without difficulty. He does state however over the last few weeks he has note mild intermittent DOE. He notes no associated palpitations, near syncope or chest pain and states symptoms are mild and infrequent.   ED Course:  Vitals : 101.2, bp 115/68, hr 105, rr 22  sat 97%  EKG:nsr at 102, no st -twave changes  Na: 130 (132), K 4.5, CO2 17, Glu 136, cr 0.91  ast 95, Alt 131, Alkphos 172 Lactic acid 4.6,3.3 Resp , panel: neg Wbc: 4.3, hgb 10.3 (12.8 base, 11.1 s/p surgery)  UA: large LE, wbc >50, + bacterial , + rbc 11-20 Cxr: NAD CTAB IMPRESSION: 1. Bilateral striated nephrograms concerning for acute pyelonephritis. 2. Status post prostatectomy. No abnormal fluid collections identified within the prostate bed. 3. Small volume of free fluid is  identified within the pelvis. Nonspecific in the early postoperative time frame. 4. Small hiatal hernia. 5.  Aortic Atherosclerosis (ICD10-I70.0). [id number] , LR 1L, cefepime, vanc,metronidazole   Review of Systems: As per HPI otherwise 10 point review of systems negative.   Past Medical History:  Diagnosis Date   Arthritis    ED (erectile dysfunction) due to arterial insufficiency 05/05/2022   11/05/2021   GERD (gastroesophageal reflux disease)    Prostate cancer ([place]) 10/23/2022   05/05/2022, 11/05/2021   Urinary urgency 05/05/2022   11/05/2021    Past Surgical History:  Procedure Laterality Date   BACK SURGERY     LYMPH NODE DISSECTION Bilateral 01/07/2023   Procedure: PELVIC LYMPH NODE DISSECTION;  Surgeon: Alexis Frock, MD;  Location: [facility];  Service: Urology;  Laterality: Bilateral;   ROBOT ASSISTED LAPAROSCOPIC RADICAL PROSTATECTOMY N/A 01/07/2023   Procedure: XI ROBOTIC ASSISTED LAPAROSCOPIC RADICAL PROSTATECTOMY AND INDOCYANINE GREEN DYE INJECTION;  Surgeon: Alexis Frock, MD;  Location: [facility];  Service: Urology;  Laterality: N/A;  3 HRS   Surgical pathology, Gross and Microscopic examination for prosate needle  10/23/2022     reports that he has never smoked. He has never used smokeless tobacco. He reports that he does not currently use alcohol. He reports that he does not use drugs.  No Known Allergies  PMH Father - CAD/MI   Prior to Admission medications   Medication Sig Start Date End Date Taking? Authorizing Provider  acetaminophen (TYLENOL) 325 MG tablet Take 650  mg by mouth every 6 (six) hours as needed for moderate pain.    [provider]  cetirizine (ZYRTEC) 10 MG tablet Take 10 mg by mouth daily.    [provider]  diclofenac (VOLTAREN) 75 MG EC tablet Take 75 mg by mouth daily. 12/03/22   [provider]  docusate sodium (COLACE) 100 MG capsule Take 1 capsule (100 mg total) by mouth 2 (two) times daily. 01/07/23   Debbrah Alar, PA-C  HYDROcodone-acetaminophen (NORCO) 5-325 MG tablet Take 1-2 tablets by mouth every 6 (six) hours as needed for moderate pain or severe pain. 01/07/23   Debbrah Alar, PA-C  omeprazole (PRILOSEC) 40 MG capsule Take 40 mg by mouth every morning. 09/02/22   [provider]  rosuvastatin (CRESTOR) 5 MG tablet Take 5 mg by mouth daily. 09/15/22   [provider]    Physical Exam: Vitals:   01/26/23 0733 01/26/23 0736 01/26/23 0830 01/26/23 0911  BP: 93/65  97/67 99/61  Pulse: 86  83 78  Resp: '18  18 18  '$ Temp:  98.8 F (37.1 C)  98.8 F (37.1 C)  TempSrc:  Oral  Oral  SpO2: 94%  93% 98%  Weight:        Constitutional: NAD, calm, comfortable Vitals:   01/26/23 0733 01/26/23 0736 01/26/23 0830 01/26/23 0911  BP: 93/65  97/67 99/61  Pulse: 86  83 78  Resp: '18  18 18  '$ Temp:  98.8 F (37.1 C)  98.8 F (37.1 C)  TempSrc:  Oral  Oral  SpO2: 94%  93% 98%  Weight:       Eyes: PERRL, lids and conjunctivae normal ENMT: Mucous membranes are moist. Posterior pharynx clear of any exudate or lesions.Normal dentition.  Neck: normal, supple, no masses, no thyromegaly Respiratory: clear to auscultation bilaterally, no wheezing, no crackles. Normal respiratory effort. No accessory muscle use.  Cardiovascular: Regular rate and rhythm, no murmurs / rubs / gallops. No extremity edema. 2+ pedal pulses.  Abdomen: no tenderness, no masses palpated. No hepatosplenomegaly. Bowel sounds positive.  Musculoskeletal: no clubbing / cyanosis. No joint deformity upper and lower extremities. Good ROM, no contractures. Normal muscle tone.  Skin: no rashes, lesions, ulcers. No induration Neurologic: CN 2-12 grossly intact. Sensation intact, DTR normal. Strength 5/5 in all 4.  Psychiatric: Normal judgment and insight. Alert and oriented x 3. Normal mood.    Labs on Admission: I have personally reviewed following labs and imaging studies  CBC: Recent Labs  Lab 01/26/23 0348  WBC  4.3  NEUTROABS 3.8  HGB 10.3*  HCT 29.2*  MCV 93.6  PLT [id number]   Basic Metabolic Panel: Recent Labs  Lab 01/26/23 0348  NA 130*  K 4.5  CL 98  CO2 17*  GLUCOSE 136*  BUN 19  CREATININE 0.91  CALCIUM 8.7*   GFR: Estimated Creatinine Clearance: 78 mL/min (by C-G formula based on SCr of 0.91 mg/dL). Liver Function Tests: Recent Labs  Lab 01/26/23 0348  AST 95*  ALT 131*  ALKPHOS 172*  BILITOT 0.8  PROT 6.7  ALBUMIN 3.5   No results for input(s): "LIPASE", "AMYLASE" in the last 168 hours. No results for input(s): "AMMONIA" in the last 168 hours. Coagulation Profile: No results for input(s): "INR", "PROTIME" in the last 168 hours. Cardiac Enzymes: No results for input(s): "CKTOTAL", "CKMB", "CKMBINDEX", "TROPONINI" in the last 168 hours. BNP (last 3 results) No results for input(s): "PROBNP" in the last 8760 hours. HbA1C: No results for input(s): "HGBA1C"  in the last 72 hours. CBG: No results for input(s): "GLUCAP" in the last 168 hours. Lipid Profile: No results for input(s): "CHOL", "HDL", "LDLCALC", "TRIG", "CHOLHDL", "LDLDIRECT" in the last 72 hours. Thyroid Function Tests: No results for input(s): "TSH", "T4TOTAL", "FREET4", "T3FREE", "THYROIDAB" in the last 72 hours. Anemia Panel: No results for input(s): "VITAMINB12", "FOLATE", "FERRITIN", "TIBC", "IRON", "RETICCTPCT" in the last 72 hours. Urine analysis:    Component Value Date/Time   COLORURINE YELLOW 01/26/2023 0348   APPEARANCEUR CLEAR 01/26/2023 0348   LABSPEC 1.028 01/26/2023 0348   PHURINE 6.5 01/26/2023 0348   GLUCOSEU NEGATIVE 01/26/2023 0348   HGBUR TRACE (A) 01/26/2023 0348   BILIRUBINUR NEGATIVE 01/26/2023 0348   KETONESUR 15 (A) 01/26/2023 0348   PROTEINUR 30 (A) 01/26/2023 0348   NITRITE NEGATIVE 01/26/2023 0348   LEUKOCYTESUR LARGE (A) 01/26/2023 0348    Radiological Exams on Admission: CT Abdomen Pelvis W Contrast  Result Date: 01/26/2023 CLINICAL DATA:  Pain and fever after  prostatectomy. EXAM: CT ABDOMEN AND PELVIS WITH CONTRAST TECHNIQUE: Multidetector CT imaging of the abdomen and pelvis was performed using the standard protocol following bolus administration of intravenous contrast. RADIATION DOSE REDUCTION: This exam was performed according to the departmental dose-optimization program which includes automated exposure control, adjustment of the mA and/or kV according to patient size and/or use of iterative reconstruction technique. CONTRAST:  182m OMNIPAQUE IOHEXOL 300 MG/ML  SOLN COMPARISON:  None Available. FINDINGS: Lower chest: No acute abnormality. Hepatobiliary: No focal liver abnormality is seen. No gallstones, gallbladder wall thickening, or biliary dilatation. Pancreas: Unremarkable. No pancreatic ductal dilatation or surrounding inflammatory changes. Spleen: Normal in size without focal abnormality. Adrenals/Urinary Tract: Normal adrenal glands. Bosniak class 1 and 2 kidney cysts identified. The largest arises off the upper pole of the left kidney measuring 1.4 cm. No follow-up imaging recommended. Bilateral striated nephrograms identified concerning for pyelonephritis. No nephrolithiasis or hydronephrosis. Urinary bladder is unremarkable. Stomach/Bowel: Small hiatal hernia. The appendix is visualized and is within normal limits. Sigmoid diverticulosis without signs of acute diverticulitis. No bowel wall thickening, inflammation or distension. Vascular/Lymphatic: Aortic atherosclerosis. No abdominopelvic adenopathy. Reproductive: Status post prostatectomy. No abnormal fluid collections identified within the prostate bed. Other: Small volume of free fluid is identified within the pelvis. No focal fluid collections. Musculoskeletal: No acute or significant osseous findings. IMPRESSION: 1. Bilateral striated nephrograms concerning for acute pyelonephritis. 2. Status post prostatectomy. No abnormal fluid collections identified within the prostate bed. 3. Small volume of  free fluid is identified within the pelvis. Nonspecific in the early postoperative time frame. 4. Small hiatal hernia. 5.  Aortic Atherosclerosis (ICD10-I70.0). Electronically Signed   By: TKerby MoorsM.D.   On: 01/26/2023 05:14   DG Chest Port 1 View  Result Date: 01/26/2023 CLINICAL DATA:  72year old male with possible sepsis. Shortness of breath. EXAM: PORTABLE CHEST 1 VIEW COMPARISON:  None Available. FINDINGS: Portable AP view at 0403 hours. Normal lung volumes and mediastinal contours. Visualized tracheal air column is within normal limits. Allowing for portable technique the lungs are clear. No pneumothorax or pleural effusion. Paucity of bowel gas in the visible abdomen. No acute osseous abnormality identified. IMPRESSION: Negative portable chest. Electronically Signed   By: HGenevie AnnM.D.   On: 01/26/2023 04:15    EKG: Independently reviewed.   Assessment/Plan  Severe Sepsis Secondary to Pyelonephritis  -admit to PCU - continue on vanc/cefepime  -patient still with soft bp will give 1 L  LR bolus s/p which will continue with LR at  150  - f/u on blood /urine cultures, lactic , procal /crp  -strict I/o  SOB/DOE intermittent  - cxr negative  -currently at rest w/o symptoms  - CT thorax chest to be complete -BNP , echo   Anemia  -currently 10.8 down from 12.8 -presumed post op blood loss - monitor h/h -check anemia labs  - S/p prostatectomy 2/24 - CT notes no acute post-op findings  -patient w/o urinary complaints    GERD --ppi  HLD -continue on statin    DVT prophylaxis:  heparin sq  Code Status: full code  Family Communication: none at bedside Disposition Plan:patient  expected to be admitted greater than 2 midnights  Consults called: n/a Admission status: progressive care   Clance Boll MD [facility]   If 7PM-7AM, please contact night-coverage [email or web address] Password [facility]  01/26/2023, 10:06 AM

## 2023-01-26 NOTE — Progress Notes (Signed)
[place] for heparin Indication: chest pain/ACS  No Known Allergies  Patient Measurements: Height: '5\' 10"'$  (177.8 cm) Weight: 77 kg (169 lb 12.1 oz) IBW/kg (Calculated) : 73 Heparin Dosing Weight: 77 kg  Vital Signs: Temp: 98 F (36.7 C) (03/11 1200) Temp Source: Oral (03/11 1200) BP: 89/57 (03/11 1400) Pulse Rate: 69 (03/11 1400)  Labs: Recent Labs    01/26/23 0348 01/26/23 1051 01/26/23 1202 01/26/23 1432  HGB 10.3* 9.4*  --   --   HCT 29.2* 27.5*  --   --   PLT 189 159  --   --   APTT  --  39*  --   --   LABPROT  --  16.9*  --   --   INR  --  1.4*  --   --   CREATININE 0.91 0.75  --   --   TROPONINIHS  --   --  1,432* 1,522*    Estimated Creatinine Clearance: 88.7 mL/min (by C-G formula based on SCr of 0.75 mg/dL).   Assessment: Patient is a 72 y.o M with hx prostate cancer (s/p prostatectomy and pelvic lymph node dissection on 01/08/23) who presented to the ED on 01/26/23 with c/o SOB and fever. He was started on abx on admission for sepsis/UTI.  Troponin was also found to be elevated. Pharmacy has been consulted to dose heparin for ACS.   Goal of Therapy:  Heparin level 0.3-0.7 units/ml Monitor platelets by anticoagulation protocol: Yes   Plan:  - heparin 4000 units Iv x1 bolus, then drip at 950 units/hr - check 8 hr heparin level  - monitor for s/sx bleeding   Dia Sitter P 01/26/2023,3:35 PM

## 2023-01-26 NOTE — Progress Notes (Signed)
Pharmacy Antibiotic Note  Ezel Kope. is a 72 y.o. male s/p prostatectomy 12/2022 admitted on 01/26/2023 with urosepsis.  Pharmacy has been consulted for Vancomycin and Cefepime dosing.  Plan: Vancomycin 1000 mg IV q12h Cefepime 2 g IV q8h  Weight: 79.4 kg (175 lb)  Temp (24hrs), Avg:100.5 F (38.1 C), Min:99.3 F (37.4 C), Max:101.2 F (38.4 C)  Recent Labs  Lab 01/26/23 0348 01/26/23 0600  WBC 4.3  --   CREATININE 0.91  --   LATICACIDVEN 4.6* 3.3*    Estimated Creatinine Clearance: 78 mL/min (by C-G formula based on SCr of 0.91 mg/dL).    No Known Allergies   Caryl Pina 01/26/2023 7:00 AM

## 2023-01-26 NOTE — ED Notes (Signed)
Dr. Karle Starch aware of 3.3 lactic result.

## 2023-01-26 NOTE — ED Notes (Signed)
Dr. Karle Starch aware of lactic of 4.6

## 2023-01-27 ENCOUNTER — Inpatient Hospital Stay (HOSPITAL_COMMUNITY): Payer: PPO

## 2023-01-27 DIAGNOSIS — A419 Sepsis, unspecified organism: Secondary | ICD-10-CM | POA: Diagnosis not present

## 2023-01-27 DIAGNOSIS — I5031 Acute diastolic (congestive) heart failure: Secondary | ICD-10-CM

## 2023-01-27 DIAGNOSIS — B9620 Unspecified Escherichia coli [E. coli] as the cause of diseases classified elsewhere: Secondary | ICD-10-CM | POA: Diagnosis not present

## 2023-01-27 DIAGNOSIS — Z1612 Extended spectrum beta lactamase (ESBL) resistance: Secondary | ICD-10-CM

## 2023-01-27 DIAGNOSIS — I214 Non-ST elevation (NSTEMI) myocardial infarction: Secondary | ICD-10-CM | POA: Diagnosis not present

## 2023-01-27 DIAGNOSIS — N390 Urinary tract infection, site not specified: Secondary | ICD-10-CM | POA: Diagnosis not present

## 2023-01-27 LAB — CBC
HCT: 25.1 % — ABNORMAL LOW (ref 39.0–52.0)
Hemoglobin: 8.3 g/dL — ABNORMAL LOW (ref 13.0–17.0)
MCH: 32.7 pg (ref 26.0–34.0)
MCHC: 33.1 g/dL (ref 30.0–36.0)
MCV: 98.8 fL (ref 80.0–100.0)
Platelets: 137 10*3/uL — ABNORMAL LOW (ref 150–400)
RBC: 2.54 MIL/uL — ABNORMAL LOW (ref 4.22–5.81)
RDW: 12 % (ref 11.5–15.5)
WBC: 7.2 10*3/uL (ref 4.0–10.5)
nRBC: 0 % (ref 0.0–0.2)

## 2023-01-27 LAB — BLOOD CULTURE ID PANEL (REFLEXED) - BCID2

## 2023-01-27 LAB — COMPREHENSIVE METABOLIC PANEL
ALT: 88 U/L — ABNORMAL HIGH (ref 0–44)
AST: 69 U/L — ABNORMAL HIGH (ref 15–41)
Albumin: 2.6 g/dL — ABNORMAL LOW (ref 3.5–5.0)
Alkaline Phosphatase: 164 U/L — ABNORMAL HIGH (ref 38–126)
Anion gap: 7 (ref 5–15)
BUN: 14 mg/dL (ref 8–23)
CO2: 23 mmol/L (ref 22–32)
Calcium: 7.7 mg/dL — ABNORMAL LOW (ref 8.9–10.3)
Chloride: 100 mmol/L (ref 98–111)
Creatinine, Ser: 0.79 mg/dL (ref 0.61–1.24)
GFR, Estimated: >60 mL/min (ref >60)
Glucose, Bld: 108 mg/dL — ABNORMAL HIGH (ref 70–99)
Potassium: 4 mmol/L (ref 3.5–5.1)
Sodium: 130 mmol/L — ABNORMAL LOW (ref 135–145)
Total Bilirubin: 0.8 mg/dL (ref 0.3–1.2)
Total Protein: 5.3 g/dL — ABNORMAL LOW (ref 6.5–8.1)

## 2023-01-27 LAB — GLUCOSE, CAPILLARY: Glucose-Capillary: 91 mg/dL (ref 70–99)

## 2023-01-27 LAB — ECHOCARDIOGRAM COMPLETE
AR max vel: 3.1 cm2
AV Area VTI: 3.05 cm2
AV Area mean vel: 3.1 cm2
AV Mean grad: 3 mmHg
AV Peak grad: 5.6 mmHg
Ao pk vel: 1.18 m/s
Area-P 1/2: 4.06 cm2
Calc EF: 62 %
Height: 70 in
MV M vel: 3.52 m/s
MV Peak grad: 49.6 mmHg
S' Lateral: 3.5 cm
Single Plane A2C EF: 64.2 %
Single Plane A4C EF: 60.1 %
Weight: 2797.2 oz

## 2023-01-27 LAB — HEPARIN LEVEL (UNFRACTIONATED)
Heparin Unfractionated: <0.1 IU/mL — ABNORMAL LOW (ref 0.30–0.70)
Heparin Unfractionated: 0.24 IU/mL — ABNORMAL LOW (ref 0.30–0.70)
Heparin Unfractionated: 0.33 IU/mL (ref 0.30–0.70)

## 2023-01-27 LAB — HEMOGLOBIN A1C
Hgb A1c MFr Bld: 5.5 % (ref 4.8–5.6)
Mean Plasma Glucose: 111 mg/dL

## 2023-01-27 LAB — LACTIC ACID, PLASMA: Lactic Acid, Venous: 1 mmol/L (ref 0.5–1.9)

## 2023-01-27 MED ORDER — FUROSEMIDE 10 MG/ML IJ SOLN
40.0000 mg | Freq: Once | INTRAMUSCULAR | Status: AC
  Administered 2023-01-27: 40 mg via INTRAVENOUS
  Filled 2023-01-27: qty 4

## 2023-01-27 MED ORDER — HEPARIN BOLUS VIA INFUSION
1000.0000 [IU] | Freq: Once | INTRAVENOUS | Status: AC
  Administered 2023-01-27: 1000 [IU] via INTRAVENOUS
  Filled 2023-01-27: qty 1000

## 2023-01-27 MED ORDER — ROSUVASTATIN CALCIUM 10 MG PO TABS
5.0000 mg | ORAL_TABLET | Freq: Every day | ORAL | Status: DC
  Administered 2023-01-27 – 2023-01-30 (×4): 5 mg via ORAL
  Filled 2023-01-27 (×4): qty 1

## 2023-01-27 MED ORDER — HEPARIN BOLUS VIA INFUSION
2000.0000 [IU] | Freq: Once | INTRAVENOUS | Status: AC
  Administered 2023-01-27: 2000 [IU] via INTRAVENOUS
  Filled 2023-01-27: qty 2000

## 2023-01-27 MED ORDER — SODIUM CHLORIDE 0.9 % IV SOLN
1.0000 g | Freq: Three times a day (TID) | INTRAVENOUS | Status: DC
  Administered 2023-01-27 – 2023-01-29 (×8): 1 g via INTRAVENOUS
  Filled 2023-01-27 (×8): qty 20

## 2023-01-27 MED ORDER — PANTOPRAZOLE SODIUM 40 MG PO TBEC
40.0000 mg | DELAYED_RELEASE_TABLET | Freq: Every day | ORAL | Status: DC
  Administered 2023-01-27 – 2023-01-30 (×4): 40 mg via ORAL
  Filled 2023-01-27 (×4): qty 1

## 2023-01-27 NOTE — Progress Notes (Signed)
ANTICOAGULATION CONSULT NOTE   Pharmacy Consult for heparin Indication: chest pain/ACS  No Known Allergies  Patient Measurements: Height: '5\' 10"'$  (177.8 cm) Weight: 77 kg (169 lb 12.1 oz) IBW/kg (Calculated) : 73 Heparin Dosing Weight: 77 kg  Vital Signs: Temp: 98.4 F (36.9 C) (03/12 0215) Temp Source: Oral (03/12 0215) BP: 96/58 (03/12 0200) Pulse Rate: 63 (03/12 0215)  Labs: Recent Labs    01/26/23 0348 01/26/23 1051 01/26/23 1202 01/26/23 1432 01/26/23 1622 01/26/23 2043 01/27/23 0044  HGB 10.3* 9.4*  --   --   --   --  8.3*  HCT 29.2* 27.5*  --   --   --   --  25.1*  PLT 189 159  --   --   --   --  137*  APTT  --  39*  --   --   --   --   --   LABPROT  --  16.9*  --   --   --   --   --   INR  --  1.4*  --   --   --   --   --   HEPARINUNFRC  --   --   --   --   --   --  <0.10*  CREATININE 0.91 0.75  --   --   --   --  0.79  TROPONINIHS  --   --    < > 1,522* 1,438* 1,591*  --    < > = values in this interval not displayed.     Estimated Creatinine Clearance: 88.7 mL/min (by C-G formula based on SCr of 0.79 mg/dL).   Assessment: Patient is a 72 y.o M with hx prostate cancer (s/p prostatectomy and pelvic lymph node dissection on 01/08/23) who presented to the ED on 01/26/23 with c/o SOB and fever. He was started on abx on admission for sepsis/UTI.  Troponin was also found to be elevated. Pharmacy has been consulted to dose heparin for ACS.  01/27/2023: Initial heparin level <0.1: sub-therapeutic on IV heparin 950 units/hr CBC: Hg 8.3, pltc 137- both low & slightly lower than on admission which is likely dilutional effect.  No bleeding or infusion related concerns per RN.  Heparin was stopped for CT last night ~9p & was off <20 min (was restarted sooner than charting reflects due to RN being off floor when it was resumed.)    Goal of Therapy:  Heparin level 0.3-0.7 units/ml Monitor platelets by anticoagulation protocol: Yes   Plan:  - Re-bolus heparin 2000  units IV x1  - Increase heparin infusion to 1200 units/hr - Check 8 hr heparin level after rate increase - Monitor for s/sx bleeding  - Daily CBC  Netta Cedars PharmD 01/27/2023,2:47 AM

## 2023-01-27 NOTE — Progress Notes (Signed)
PHARMACY - PHYSICIAN COMMUNICATION CRITICAL VALUE ALERT - BLOOD CULTURE IDENTIFICATION (BCID)  Trevor Wilson. is an 72 y.o. male who presented to [facility] on 01/26/2023 with a chief complaint of fever & chills.  Assessment:  Admit with pyelonephritis.  Still with fever.  Blood cx now + GNR; BCID + ESBL Ecoli.   Name of physician (or Provider) Contacted: Clarene Essex, NP  Current antibiotics: Cefepime + Vancomycin  Changes to prescribed antibiotics recommended:  Change current antibiotics to Meropenem 1gm IV q8h  Results for orders placed or performed during the hospital encounter of 01/26/23  Blood Culture ID Panel (Reflexed) (Collected: 01/26/2023  3:48 AM)  Result Value Ref Range   Enterococcus faecalis NOT DETECTED NOT DETECTED   Enterococcus Faecium NOT DETECTED NOT DETECTED   Listeria monocytogenes NOT DETECTED NOT DETECTED   Staphylococcus species NOT DETECTED NOT DETECTED   Staphylococcus aureus (BCID) NOT DETECTED NOT DETECTED   Staphylococcus epidermidis NOT DETECTED NOT DETECTED   Staphylococcus lugdunensis NOT DETECTED NOT DETECTED   Streptococcus species NOT DETECTED NOT DETECTED   Streptococcus agalactiae NOT DETECTED NOT DETECTED   Streptococcus pneumoniae NOT DETECTED NOT DETECTED   Streptococcus pyogenes NOT DETECTED NOT DETECTED   A.calcoaceticus-baumannii NOT DETECTED NOT DETECTED   Bacteroides fragilis NOT DETECTED NOT DETECTED   Enterobacterales DETECTED (A) NOT DETECTED   Enterobacter cloacae complex NOT DETECTED NOT DETECTED   Escherichia coli DETECTED (A) NOT DETECTED   Klebsiella aerogenes NOT DETECTED NOT DETECTED   Klebsiella oxytoca NOT DETECTED NOT DETECTED   Klebsiella pneumoniae NOT DETECTED NOT DETECTED   Proteus species NOT DETECTED NOT DETECTED   Salmonella species NOT DETECTED NOT DETECTED   Serratia marcescens NOT DETECTED NOT DETECTED   Haemophilus influenzae NOT DETECTED NOT DETECTED   Neisseria meningitidis NOT DETECTED NOT  DETECTED   Pseudomonas aeruginosa NOT DETECTED NOT DETECTED   Stenotrophomonas maltophilia NOT DETECTED NOT DETECTED   Candida albicans NOT DETECTED NOT DETECTED   Candida auris NOT DETECTED NOT DETECTED   Candida glabrata NOT DETECTED NOT DETECTED   Candida krusei NOT DETECTED NOT DETECTED   Candida parapsilosis NOT DETECTED NOT DETECTED   Candida tropicalis NOT DETECTED NOT DETECTED   Cryptococcus neoformans/gattii NOT DETECTED NOT DETECTED   CTX-M ESBL DETECTED (A) NOT DETECTED   Carbapenem resistance IMP NOT DETECTED NOT DETECTED   Carbapenem resistance KPC NOT DETECTED NOT DETECTED   Carbapenem resistance NDM NOT DETECTED NOT DETECTED   Carbapenem resist OXA 48 LIKE NOT DETECTED NOT DETECTED   Carbapenem resistance VIM NOT DETECTED NOT DETECTED    Netta Cedars PharmD 01/27/2023  1:31 AM

## 2023-01-27 NOTE — Progress Notes (Signed)
  Transition of Care [facility]) Screening Note   Patient Details  Name: Trevor Wilson. Date of Birth: 04-10-1951   Transition of Care [facility]) CM/SW Contact:    Roseanne Kaufman, RN Phone Number: 01/27/2023, 3:21 PM    Transition of Care Department [facility]) has reviewed patient and no TOC needs have been identified at this time. We will continue to monitor patient advancement through interdisciplinary progression rounds. If new patient transition needs arise, please place a TOC consult.

## 2023-01-27 NOTE — Consult Note (Addendum)
Cardiology Consultation   Patient ID: Trevor Wilson. MRN: [id number]; DOB: January 05, 1951  Admit date: 01/26/2023 Date of Consult: 01/27/2023  PCP:  Lujean Amel, [place] Providers Cardiologist:  Freada Bergeron, MD   Patient Profile:   Trevor Wilson. is a 72 y.o. male with a hx of HLD, GERD, and prostate cancer with recent resection who is being seen 01/27/2023 for the evaluation of NSTEMI at the request of Dr. Alfredia Ferguson.  History of Present Illness:   Trevor Wilson has no known prior cardiac history and is not known to [facility]. He has a significant family history of CAD: father had MI at age 34 and died a year later (?ICM), and two uncles passing in their 55s from MI's. He is a never smoker, not diabetic, and avoids saturated fats. He is active and hikes 3 times per week about 2 miles.   He was recently hospitalized overnight following robotic assisted radical prostatectomy with bilateral pelvic lymph node dissection 01/07/23. He tolerated the procedure well and was discharged the following day with foley in place.   He presented to [facility] ED 01/26/23 with fever of 101 and shortness of breath. He woke up from sleep with whole body shaking, this had been going on for 3 days. Foley had been discontinued. Lactic acid elevated at 4.6 and CT scan significant for pylenophritis. CXR did not show evidence of edema, congestion, or PNA. Sepsis protocol initiated and  he was admitted to medicine service and transported to [facility]  He reports DOE over the last few weeks: was unable to do his usual hikes without taking breaks. Given family history of CAD, personal hx of HLD and vague SOB/DOE, cardiac enzymes were obtained and found elevated. Cardiology consulted.  HST 1432 --> 1522 --> 1438 --> 1591 BNP 850  He reports about 1 month of DOE progressing to SOB at rest. He denies orthopnea and PND. On the nights preceding his admission, he reports  subjective fever and chills. He has a new O2 requirement and becomes significantly dyspneic sitting up for my exam. Unclear if he has gained weight suddenly, but reports  his pants may be fitting snugly. He denies chest pain and palpitations.   Past Medical History:  Diagnosis Date   Arthritis    ED (erectile dysfunction) due to arterial insufficiency 05/05/2022   11/05/2021   GERD (gastroesophageal reflux disease)    Prostate cancer ([place]) 10/23/2022   05/05/2022, 11/05/2021   Urinary urgency 05/05/2022   11/05/2021    Past Surgical History:  Procedure Laterality Date   BACK SURGERY     LYMPH NODE DISSECTION Bilateral 01/07/2023   Procedure: PELVIC LYMPH NODE DISSECTION;  Surgeon: Alexis Frock, MD;  Location: [facility] ORS;  Service: Urology;  Laterality: Bilateral;   ROBOT ASSISTED LAPAROSCOPIC RADICAL PROSTATECTOMY N/A 01/07/2023   Procedure: XI ROBOTIC ASSISTED LAPAROSCOPIC RADICAL PROSTATECTOMY AND INDOCYANINE GREEN DYE INJECTION;  Surgeon: Alexis Frock, MD;  Location: [facility] ORS;  Service: Urology;  Laterality: N/A;  3 HRS   Surgical pathology, Gross and Microscopic examination for prosate needle  10/23/2022     Home Medications:  Prior to Admission medications   Medication Sig Start Date End Date Taking? Authorizing Provider  acetaminophen (TYLENOL) 325 MG tablet Take 650 mg by mouth every 6 (six) hours as needed for moderate pain.   Yes [provider]  diclofenac (VOLTAREN) 75 MG EC tablet Take 75 mg by mouth daily. 12/03/22  Yes [provider]  Multiple Vitamin (MULTIVITAMIN PO) Take 1 tablet by mouth daily.   Yes [provider]  omeprazole (PRILOSEC) 40 MG capsule Take 40 mg by mouth every morning. 09/02/22  Yes [provider]  rosuvastatin (CRESTOR) 5 MG tablet Take 5 mg by mouth daily. 09/15/22  Yes [provider]  docusate sodium (COLACE) 100 MG capsule Take 1 capsule (100 mg total) by mouth 2 (two) times daily. Patient not  taking: Reported on 01/26/2023 01/07/23   Debbrah Alar, PA-C  HYDROcodone-acetaminophen (NORCO) 5-325 MG tablet Take 1-2 tablets by mouth every 6 (six) hours as needed for moderate pain or severe pain. Patient not taking: Reported on 01/26/2023 01/07/23   Debbrah Alar, PA-C    Inpatient Medications: Scheduled Meds:  aspirin EC  81 mg Oral Daily   Chlorhexidine Gluconate Cloth  6 each Topical Daily   Continuous Infusions:  sodium chloride 50 mL/hr at 01/27/23 0700   heparin 1,200 Units/hr (01/27/23 0700)   meropenem (MERREM) IV Stopped (01/27/23 0243)   PRN Meds: acetaminophen **OR** acetaminophen, albuterol, magnesium hydroxide, nitroGLYCERIN, ondansetron **OR** ondansetron (ZOFRAN) IV  Allergies:   No Known Allergies  Social History:   Social History   Socioeconomic History   Marital status: Married    Spouse name: Not on file   Number of children: Not on file   Years of education: Not on file   Highest education level: Not on file  Occupational History   Not on file  Tobacco Use   Smoking status: Never   Smokeless tobacco: Never  Vaping Use   Vaping Use: Never used  Substance and Sexual Activity   Alcohol use: Not Currently    Comment: Has 2 driniks per month.   Drug use: Never   Sexual activity: Not on file  Other Topics Concern   Not on file  Social History Narrative   Not on file   Social Determinants of Health   Financial Resource Strain: Not on file  Food Insecurity: No Food Insecurity (01/26/2023)   Hunger Vital Sign    Worried About Running Out of Food in the Last Year: Never true    Ran Out of Food in the Last Year: Never true  Transportation Needs: No Transportation Needs (01/26/2023)   PRAPARE - Hydrologist (Medical): No    Lack of Transportation (Non-Medical): No  Physical Activity: Not on file  Stress: Not on file  Social Connections: Not on file  Intimate Partner Violence: Not At Risk (01/26/2023)   Humiliation,  Afraid, Rape, and Kick questionnaire    Fear of Current or Ex-Partner: No    Emotionally Abused: No    Physically Abused: No    Sexually Abused: No    Family History:   History reviewed. No pertinent family history.   ROS:  Please see the history of present illness.   All other ROS reviewed and negative.     Physical Exam/Data:   Vitals:   01/27/23 0600 01/27/23 0610 01/27/23 0700 01/27/23 0800  BP: 134/65  (!) 125/59   Pulse: 86 87 87   Resp: (!) 26 18 (!) 26   Temp:    99.1 F (37.3 C)  TempSrc:    Oral  SpO2: 99% 100% 100%   Weight:      Height:        Intake/Output Summary (Last 24 hours) at 01/27/2023 0857 Last data filed at 01/27/2023 0700 Gross per 24 hour  Intake 5082.41 ml  Output  1750 ml  Net 3332.41 ml      01/27/2023    3:26 AM 01/26/2023   10:00 AM 01/26/2023    4:45 AM  Last 3 Weights  Weight (lbs) 174 lb 13.2 oz 169 lb 12.1 oz 175 lb  Weight (kg) 79.3 kg 77 kg 79.379 kg     Body mass index is 25.08 kg/m.  General:  Well nourished, well developed, in no acute distress HEENT: normal Neck: + JVD Vascular: No carotid bruits; Distal pulses 2+ bilaterally Cardiac:  normal S1, S2; RRR; no murmur  Lungs:  crackles in right base, respirations unlabored Abd: soft, nontender, no hepatomegaly  Ext: no edema Musculoskeletal:  No deformities, BUE and BLE strength normal and equal Skin: warm and dry  Neuro:  CNs 2-12 intact, no focal abnormalities noted Psych:  Normal affect   EKG:  The EKG was personally reviewed and demonstrates: sinus tachycardia with HR 101, poor R wave progression  Telemetry:  Telemetry was personally reviewed and demonstrates:  sinus to sinus tachycardia HR 80-100s, PACs, PVCs  Relevant CV Studies:  Echo pending  Laboratory Data:  High Sensitivity Troponin:   Recent Labs  Lab 01/26/23 1202 01/26/23 1432 01/26/23 1622 01/26/23 2043  TROPONINIHS 1,432* 1,522* 1,438* 1,591*     Chemistry Recent Labs  Lab 01/26/23 0348  01/26/23 1051 01/27/23 0044  NA 130* 131* 130*  K 4.5 3.8 4.0  CL 98 97* 100  CO2 17* 22 23  GLUCOSE 136* 119* 108*  BUN '19 16 14  '$ CREATININE 0.91 0.75 0.79  CALCIUM 8.7* 8.1* 7.7*  GFRNONAA >60 >60 >60  ANIONGAP '15 12 7    '$ Recent Labs  Lab 01/26/23 0348 01/26/23 1051 01/27/23 0044  PROT 6.7 6.0* 5.3*  ALBUMIN 3.5 2.9* 2.6*  AST 95* 83* 69*  ALT 131* 109* 88*  ALKPHOS 172* 175* 164*  BILITOT 0.8 1.1 0.8   Lipids  Recent Labs  Lab 01/26/23 1432  CHOL 82  TRIG 45  HDL 37*  LDLCALC 36  CHOLHDL 2.2    Hematology Recent Labs  Lab 01/26/23 0348 01/26/23 1051 01/26/23 1202 01/27/23 0044  WBC 4.3 12.9*  --  7.2  RBC 3.12* 2.85* 2.79* 2.54*  HGB 10.3* 9.4*  --  8.3*  HCT 29.2* 27.5*  --  25.1*  MCV 93.6 96.5  --  98.8  MCH 33.0 33.0  --  32.7  MCHC 35.3 34.2  --  33.1  RDW 11.8 11.9  --  12.0  PLT 189 159  --  137*   Thyroid  Recent Labs  Lab 01/26/23 1051  TSH 1.071    BNP Recent Labs  Lab 01/26/23 1051 01/26/23 2213  BNP 269.7* 849.4*    DDimer No results for input(s): "DDIMER" in the last 168 hours.   Radiology/Studies:  CT Angio Chest Pulmonary Embolism (PE) W or WO Contrast  Result Date: 01/26/2023 CLINICAL DATA:  High probability for PE. Status post prostatectomy. Shortness of breath and fever. EXAM: CT ANGIOGRAPHY CHEST WITH CONTRAST TECHNIQUE: Multidetector CT imaging of the chest was performed using the standard protocol during bolus administration of intravenous contrast. Multiplanar CT image reconstructions and MIPs were obtained to evaluate the vascular anatomy. RADIATION DOSE REDUCTION: This exam was performed according to the departmental dose-optimization program which includes automated exposure control, adjustment of the mA and/or kV according to patient size and/or use of iterative reconstruction technique. CONTRAST:  73m OMNIPAQUE IOHEXOL 350 MG/ML SOLN COMPARISON:  CT abdomen and pelvis same day FINDINGS:  Cardiovascular: Satisfactory  opacification of the pulmonary arteries to the segmental level. No evidence of pulmonary embolism. Normal heart size. No pericardial effusion. There are atherosclerotic calcifications of the aorta. Mediastinum/Nodes: There are enlarged paratracheal lymph nodes measuring up to 10 mm short axis. Enlarged subcarinal lymph node measures 12 mm. Enlarged right hilar lymph node measures 15 mm. Enlarged left hilar lymph node measures 12 mm. Esophagus is within normal limits. There is a small hiatal hernia. Visualized thyroid gland is within normal limits. Lungs/Pleura: There are trace bilateral pleural effusions. There some scattered patchy ground-glass opacities in both mid and lower lungs with some smooth interlobular septal thickening in the lung bases. Trachea and central airways are patent. No pneumothorax. Upper Abdomen: No acute abnormality. Musculoskeletal: There is mild compression deformity of the superior endplate of T7 which is age indeterminate. Review of the MIP images confirms the above findings. IMPRESSION: 1. No evidence for pulmonary embolism. 2. Trace bilateral pleural effusions. 3. Patchy ground-glass opacities in the mid and lower lungs with smooth interlobular septal thickening in the lung bases worrisome for pulmonary edema. 4. Mediastinal and bilateral hilar lymphadenopathy of uncertain etiology. 5. Age-indeterminate T7 compression deformity. Aortic Atherosclerosis (ICD10-I70.0). Electronically Signed   By: Ronney Asters M.D.   On: 01/26/2023 21:33   CT Abdomen Pelvis W Contrast  Result Date: 01/26/2023 CLINICAL DATA:  Pain and fever after prostatectomy. EXAM: CT ABDOMEN AND PELVIS WITH CONTRAST TECHNIQUE: Multidetector CT imaging of the abdomen and pelvis was performed using the standard protocol following bolus administration of intravenous contrast. RADIATION DOSE REDUCTION: This exam was performed according to the departmental dose-optimization program which includes automated exposure  control, adjustment of the mA and/or kV according to patient size and/or use of iterative reconstruction technique. CONTRAST:  148m OMNIPAQUE IOHEXOL 300 MG/ML  SOLN COMPARISON:  None Available. FINDINGS: Lower chest: No acute abnormality. Hepatobiliary: No focal liver abnormality is seen. No gallstones, gallbladder wall thickening, or biliary dilatation. Pancreas: Unremarkable. No pancreatic ductal dilatation or surrounding inflammatory changes. Spleen: Normal in size without focal abnormality. Adrenals/Urinary Tract: Normal adrenal glands. Bosniak class 1 and 2 kidney cysts identified. The largest arises off the upper pole of the left kidney measuring 1.4 cm. No follow-up imaging recommended. Bilateral striated nephrograms identified concerning for pyelonephritis. No nephrolithiasis or hydronephrosis. Urinary bladder is unremarkable. Stomach/Bowel: Small hiatal hernia. The appendix is visualized and is within normal limits. Sigmoid diverticulosis without signs of acute diverticulitis. No bowel wall thickening, inflammation or distension. Vascular/Lymphatic: Aortic atherosclerosis. No abdominopelvic adenopathy. Reproductive: Status post prostatectomy. No abnormal fluid collections identified within the prostate bed. Other: Small volume of free fluid is identified within the pelvis. No focal fluid collections. Musculoskeletal: No acute or significant osseous findings. IMPRESSION: 1. Bilateral striated nephrograms concerning for acute pyelonephritis. 2. Status post prostatectomy. No abnormal fluid collections identified within the prostate bed. 3. Small volume of free fluid is identified within the pelvis. Nonspecific in the early postoperative time frame. 4. Small hiatal hernia. 5.  Aortic Atherosclerosis (ICD10-I70.0). Electronically Signed   By: TKerby MoorsM.D.   On: 01/26/2023 05:14   DG Chest Port 1 View  Result Date: 01/26/2023 CLINICAL DATA:  72year old male with possible sepsis. Shortness of breath.  EXAM: PORTABLE CHEST 1 VIEW COMPARISON:  None Available. FINDINGS: Portable AP view at 0403 hours. Normal lung volumes and mediastinal contours. Visualized tracheal air column is within normal limits. Allowing for portable technique the lungs are clear. No pneumothorax or pleural effusion. Paucity of bowel gas in the  visible abdomen. No acute osseous abnormality identified. IMPRESSION: Negative portable chest. Electronically Signed   By: Genevie Ann M.D.   On: 01/26/2023 04:15     Assessment and Plan:   NSTEMI HST 1432 --> 1522 --> 1438 --> 1591 EKG with poor R wave progression, no prior for comparison Heparin gtt running CTA negative for PE, do not appreciate significant coronary calcifications - troponin trend is flat and he denies chest pain, question if this is demand mismatch in the setting of sepsis and bacteremia - if echo abnormal, will likely need definitive angiography once clinically improved - will depend on Hb trend - versus dedicated coronary CTA - agree with ASA, consider starting 25 mg lopressor BID, continue statin   SOB/DOE BNP elevated to 850 He became quite dyspneic sitting up in bed Consider repeat CXR today Echo pending Suspect he may need gentle diuresis, likely tomorrow. He did receive fluid resuscitation - received multiple boluses of LR and is currently +5.5 L Question a component of diastolic dysfunciton   Sepsis secondary to UTI/pyelonephritis Bacteremia  Lactic acidosis - improved Elevated LFTs Lactic acid 4.6 --> 3.3 --> 1.5 Procalcitonin WNL ABX per primary team Blood culture positive for E. Coli, ESBL   Anemia Hb has declined this admission to 8.3, from 13.1 on 12/30/22 Initially felt likely due to expected blood loss anemia   Hyperlipidemia On 5 mg crestor 01/26/2023: Cholesterol 82; HDL 37; LDL Cholesterol 36; Triglycerides 45; VLDL 9 - excellent control LP (a) pending   Risk Assessment/Risk Scores:     TIMI Risk Score for Unstable Angina or  Non-ST Elevation MI:   The patient's TIMI risk score is  , which indicates a  % risk of all cause mortality, new or recurrent myocardial infarction or need for urgent revascularization in the next 14 days.  New York Heart Association (NYHA) Functional Class NYHA Class IV        For questions or updates, please contact [place] Please consult [email or web address] for contact info under    Signed, Ledora Bottcher, [place]  01/27/2023 8:57 AM  Patient seen and examined and agree with Trevor Sharp, PA as detailed above.  In brief, the patient is a 72 y.o. male with a hx of HLD, GERD, and prostate cancer with recent resection who presented with sepsis secondary to pyelonephritis with course complicated by NSTEMI for which Cardiology was consulted.   Patient with no known personal history of CAD, however, has strong family history of CAD as detailed above. He presented  on this admission with fever, SOB and rigors found to be septic from pyelonephritis with lactate 4.6. Trop was obtained due to SOB and was found to be elevated 1432 --> 1522 --> 1438 --> 1591. BNP 200>800s. ECG with NSR, no ischemic changes.  CTA negative for PE. TTE on my review with EF 60-65%, no WMA, moderate pulmonary HTN, no significant valve disease.  He was admitted to ICU for sepsis management and initiated on heparin gtt for NSTEMI.  Overall, suspect this is demand in the setting of severe sepsis from pyelonephritis although degree of trop rise is concerning for underlying CAD. Will continue management of sepsis and plan for coronary CTA once more stable (can be done as inpatient or outpatient). He does look volume up on exam following fluid resuscitation with BNP rising. Will give lasix '40mg'$  IV once today and monitor response.  GEN: No acute distress.   Neck: No JVD Cardiac: RRR, no murmurs, rubs, or gallops.  Respiratory: Bibasilar crackles GI: Soft, nontender, non-distended  MS: No edema, warm Neuro:  Nonfocal   Psych: Normal affect    Plan: -Suspect trop rise due to demand in the setting of sepsis -Will plan for coronary CTA once more clinically compensated (can be done as inpatient or outpatient) -Continue heparin x48h -Continue ASA '81mg'$  daily, crestor '5mg'$  daily -Agree with lasix '40mg'$  IV x 1 dose as he does appear volume up following volume resuscitation -Management of sepsis/bacteremia per primary team  Gwyndolyn Kaufman, MD

## 2023-01-27 NOTE — Progress Notes (Signed)
ANTICOAGULATION CONSULT NOTE - Follow Up Consult  Pharmacy Consult for Heparin Indication: chest pain/ACS  No Known Allergies  Patient Measurements: Height: '5\' 10"'$  (177.8 cm) Weight: 79.3 kg (174 lb 13.2 oz) IBW/kg (Calculated) : 73 Heparin Dosing Weight:  79.3 kg  Vital Signs: Temp: 98.5 F (36.9 C) (03/12 1600) Temp Source: Oral (03/12 1600) BP: 123/72 (03/12 1800) Pulse Rate: 77 (03/12 1800)  Labs: Recent Labs    01/26/23 0348 01/26/23 1051 01/26/23 1202 01/26/23 1432 01/26/23 1622 01/26/23 2043 01/27/23 0044 01/27/23 1040 01/27/23 1846  HGB 10.3* 9.4*  --   --   --   --  8.3*  --   --   HCT 29.2* 27.5*  --   --   --   --  25.1*  --   --   PLT 189 159  --   --   --   --  137*  --   --   APTT  --  39*  --   --   --   --   --   --   --   LABPROT  --  16.9*  --   --   --   --   --   --   --   INR  --  1.4*  --   --   --   --   --   --   --   HEPARINUNFRC  --   --   --   --   --   --  <0.10* 0.24* 0.33  CREATININE 0.91 0.75  --   --   --   --  0.79  --   --   TROPONINIHS  --   --    < > 1,522* 1,438* 1,591*  --   --   --    < > = values in this interval not displayed.    Estimated Creatinine Clearance: 88.7 mL/min (by C-G formula based on SCr of 0.79 mg/dL).   Assessment: AC/Heme: heparin per Rx for ACS Hgb 9.4>8.3 overnight. Plts 137 low end. Hep level 0.24>>0.33 now in goal range.  Goal of Therapy:  Heparin level 0.3-0.7 units/ml Monitor platelets by anticoagulation protocol: Yes   Plan:  Con't IV heparin at 1350 units/hr Check HL and CBC in AM.   Shaeley Segall S. Alford Highland, PharmD, BCPS Clinical Staff Pharmacist [email or web address] Alford Highland, Shaneil Yazdi Stillinger 01/27/2023,7:35 PM

## 2023-01-27 NOTE — Progress Notes (Signed)
  Echocardiogram 2D Echocardiogram has been performed.  Trevor Wilson 01/27/2023, 8:20 AM

## 2023-01-27 NOTE — Progress Notes (Signed)
ANTICOAGULATION CONSULT NOTE   Pharmacy Consult for heparin Indication: chest pain/ACS  No Known Allergies  Patient Measurements: Height: '5\' 10"'$  (177.8 cm) Weight: 79.3 kg (174 lb 13.2 oz) IBW/kg (Calculated) : 73 Heparin Dosing Weight: 77 kg  Vital Signs: Temp: 99.1 F (37.3 C) (03/12 0800) Temp Source: Oral (03/12 0800) BP: 116/72 (03/12 1000) Pulse Rate: 90 (03/12 1000)  Labs: Recent Labs    01/26/23 0348 01/26/23 1051 01/26/23 1202 01/26/23 1432 01/26/23 1622 01/26/23 2043 01/27/23 0044 01/27/23 1040  HGB 10.3* 9.4*  --   --   --   --  8.3*  --   HCT 29.2* 27.5*  --   --   --   --  25.1*  --   PLT 189 159  --   --   --   --  137*  --   APTT  --  39*  --   --   --   --   --   --   LABPROT  --  16.9*  --   --   --   --   --   --   INR  --  1.4*  --   --   --   --   --   --   HEPARINUNFRC  --   --   --   --   --   --  <0.10* 0.24*  CREATININE 0.91 0.75  --   --   --   --  0.79  --   TROPONINIHS  --   --    < > 1,522* 1,438* 1,591*  --   --    < > = values in this interval not displayed.     Estimated Creatinine Clearance: 88.7 mL/min (by C-G formula based on SCr of 0.79 mg/dL).   Assessment: Patient is a 72 y.o M with hx prostate cancer (s/p prostatectomy and pelvic lymph node dissection on 01/08/23) who presented to the ED on 01/26/23 with c/o SOB and fever. He was started on abx on admission for sepsis/UTI.  Troponin was also found to be elevated. Pharmacy has been consulted to dose heparin for ACS.  01/27/2023: Heparin level 0.24 - sub-therapeutic on heparin infusion at 1200 units/hr CBC: Hg 8.3, pltc 137- both low & down from admission (? dilutional effect)  No bleeding or infusion issues noted Per Cardiology note, plan is for 48 hours heparin  Goal of Therapy:  Heparin level 0.3-0.7 units/ml Monitor platelets by anticoagulation protocol: Yes   Plan:  - Re-bolus heparin 1000 units IV x1  - Increase heparin infusion to 1350 units/hr - Check heparin  level ~ 6 hrs after rate increase - Monitor for s/sx bleeding  - Daily CBC  Tawnya Crook, PharmD, BCPS Clinical Pharmacist 01/27/2023 11:26 AM

## 2023-01-27 NOTE — Progress Notes (Addendum)
PROGRESS NOTE    Trevor Wilson.  KG:[id number] DOB: 1951/10/22 DOA: 01/26/2023 PCP: Lujean Amel, MD   Brief Narrative:  The patient is a 72 year old overweight Caucasian male with a past medical history significant for below #2 arthritis, GERD, history of grade 1 prostate cancer diagnosed in 10/2020 status post TRUS BX progression to grade 1-3 on 1223 as well as LUTS despite use of twice daily Flomax.  And has intermediate a prostatectomy and no dissection for the above diagnosis on 01/10/2023 and subsequently developed shortness of breath, fevers, chills after his prostatectomy and presented to the med center [facility] on 01/26/2023.  He had some chills and fevers as well as rigors that were intermittent for the last 3 days and on presentation to the ED he was noted he was short of breath in the setting of high fevers and rigors.  He denied cough or chest pain or upper respiratory symptoms but also noted that he did not have any abdominal discomfort, diarrhea, dysuria.  He did have 1 episode of nausea and emesis and states that he felt quite well since his surgery and catheter removal he returned to normal activities such as hiking without difficulty for last few weeks he has noted intermittent dyspnea, no syncope chest pain or any other symptoms.  He presented and had a CT of the abdomen pelvis which showed bilateral striated nephrograms concerning for acute pyelonephritis and was found to have an elevated temperature.  He was admitted for the working diagnosis of sepsis secondary to UTI and bacteremia and as well as an NSTEMI.  Cardiology been consulted to give him diuresis and and planning for a coronary CTA once he is more stable and they recommended continuing aspirin 81 mg p.o. daily as well as Crestor 5 mg daily and continuing the heparin drip for 48 hours.  Further workup reveals that he has an ESBL E. coli bacteremia and associated UTI.  Assessment and Plan: No notes have been filed  under this hospital service. Service: Hospitalist  Severe Sepsis Secondary to Pyelonephritis and E Coli Bacteremia  -Admitted to PCU -CT Abd/Pelvis w Contrast done and showed "Bilateral striated nephrograms concerning for acute pyelonephritis. Status post prostatectomy. No abnormal fluid collections identified within the prostate bed. Small volume of free fluid is identified within the pelvis. Nonspecific in the early postoperative time frame. Small hiatal hernia. Aortic Atherosclerosis" -Had been placed on IV Vanc/Cefepime but will stop and he was changed to IV Meropenem given Cx's growing ESBL E Coli -Patient had soft bp so he was given a liter bolus and placed on maintenance IV fluid hydration however we would now stop IV fluid given his volume overload -Continue monitor cultures and follow-up on the blood culture results as well as urine culture -Urinalysis done and showed clear appearance with no bilirubin, trace hemoglobin, 15 ketones, large leukocytes, negative nitrites, few bacteria, 11-20 RBCs per high-power field, 0-5 squamous epithelial cells, greater than 50 WBCs and urine culture is growing 50,000 colony-forming units of E. coli with sensitivities still pending -WBC Trend: Recent Labs  Lab 12/30/22 1430 01/26/23 0348 01/26/23 1051 01/27/23 0044  WBC 6.9 4.3 12.9* 7.2  -Continue with antibiotic regimen and monitor temperature curve as well as WBC count and repeat CBC in the AM   SOB/DOE intermittent with Acute Respiratory Failure with Hypoxia rule out CHF -CXR negative  -currently at rest w/o symptoms  -CTA PE Protocol done and showed " No evidence for pulmonary embolism. Trace bilateral pleural effusions. Patchy  ground-glass opacities in the mid and lower lungs with smooth interlobular septal thickening in the lung bases worrisome for pulmonary edema. Mediastinal and bilateral hilar lymphadenopathy of uncertain etiology. Age-indeterminate T7 compression deformity." -BNP went  from 269.7 -> 894.4 SpO2: 91 % O2 Flow Rate (L/min): 2 L/min FiO2 (%): 21 % -Given a dose of IV Lasix by cardiology -Echocardiogram done and and pending official read -Continue with as needed albuterol 2.5 mg neb every 2 as needed for wheezing and if necessary will place on Xopenex and Atrovent scheduled -Continue to monitor for signs and symptoms of volume overload x-ray in the a.m.   Aortic Atherosclerosis -BNP has been elevated is now a 49.4 -Echocardiogram has been done and pending read -Cardiology has been consulted and appreciate further evaluation and will continue the statin and aspirin 81 mg p.o. daily -Patient is on the heparin drip she will need to continue monitor  S/p prostatectomy 2/24 -CT notes no acute post-op findings but shows "Bilateral striated nephrograms concerning for acute pyelonephritis. Status post prostatectomy. No abnormal fluid collections identified within the prostate bed. Small volume of free fluid is identified within the pelvis. Nonspecific in the early postoperative time frame. Small hiatal hernia. Aortic Atherosclerosis" Patient w/o urinary complaints   Abnormal LFT's and Elevated Alk Phos -LFT and Alk Phos Trend: Recent Labs  Lab 01/26/23 0348 01/26/23 1051 01/27/23 0044  AST 95* 83* 69*  ALT 131* 109* 88*  ALKPHOS 172* 175* 164*  -Continue to Monitor Hepatic Fxn Panel and if Necessary will obtain a RUQ U/S and an Acute Hepatitis Panel -Repeat CMP in the AM  HLD -Recent lipid panel done and showed a total cholesterol/HDL ratio of 2.2, cholesterol level of 82, HDL of 37, LDL 36, triglycerides of 45, VLDL of 9 -His statin was initially held on admission but will resume and continue 5 mg of rosuvastatin daily per cardiology recommendations with close monitoring of his hepatic function panel  NSTEMI likely type II in setting of sepsis -Troponin Trend: 1432 -> 1522 -> 1438 -> 1591 -She was having dyspnea and cardiology will give him a dose of IV  Lasix 40 mg of 1 -Echocardiogram has been ordered and patient has started on a Heparin gtt  Hyponatremia -Na+ Trend Recent Labs  Lab 01/08/23 0534 01/26/23 0348 01/26/23 1051 01/27/23 0044  NA 132* 130* 131* 130*  -Continue to Monitor and Trend and repeat CMP in the AM  Normocytic Anemia -Hgb/Hct Trend: Recent Labs  Lab 12/30/22 1430 01/07/23 1613 01/08/23 0534 01/26/23 0348 01/26/23 1051 01/27/23 0044  HGB 13.1 12.8* 11.1* 10.3* 9.4* 8.3*  HCT 38.1* 37.3* 32.2* 29.2* 27.5* 25.1*  MCV 97.4  --   --  93.6 96.5 98.8  -Checked Anemia Panel and showed an Iron Level 9, UIBC 183, TIBC of 192, saturation ratios of 5%, ferritin level 366, folate level 22.6 and vitamin B12 level of 1018 -Continue to Monitor for S/Sx of Bleeding; No overt bleeding noted -Repeat CBC in the AM  Hypoalbuminemia -Patient's Albumin Trend: Recent Labs  Lab 01/26/23 0348 01/26/23 1051 01/27/23 0044  ALBUMIN 3.5 2.9* 2.6*  -Continue to Monitor and Trend and repeat CMP in the AM   Overweight -Complicates overall prognosis and care -Estimated body mass index is 25.08 kg/m as calculated from the following:   Height as of this encounter: '5\' 10"'$  (1.778 m).   Weight as of this encounter: 79.3 kg.  -Weight Loss and Dietary Counseling given  GERD/GI Prophylaxis -C/w Omeprazole PPI substitution with Pantoprazole  40 mg Daily   DVT prophylaxis: Place and maintain sequential compression device Start: 01/26/23 1146    Code Status: Full Code Family Communication: No family present at bedside   Disposition Plan:  Level of care: Stepdown Status is: Inpatient Remains inpatient appropriate because: Needs further clinical improvement and clearance by Cardiology   Consultants:  Cardiology  Procedures:  ECHOCARDIOGRAM  Antimicrobials:  Anti-infectives (From admission, onward)    Start     Dose/Rate Route Frequency Ordered Stop   01/27/23 0600  vancomycin (VANCOCIN) IVPB 1000 mg/200 mL premix  Status:   Discontinued        1,000 mg 200 mL/hr over 60 Minutes Intravenous Every 12 hours 01/26/23 1824 01/26/23 1825   01/27/23 0600  vancomycin (VANCOCIN) IVPB 1000 mg/200 mL premix  Status:  Discontinued        1,000 mg 100 mL/hr over 120 Minutes Intravenous Every 12 hours 01/26/23 1827 01/27/23 0134   01/27/23 0200  meropenem (MERREM) 1 g in sodium chloride 0.9 % 100 mL IVPB        1 g 200 mL/hr over 30 Minutes Intravenous Every 8 hours 01/27/23 0135     01/26/23 1800  vancomycin (VANCOCIN) IVPB 1000 mg/200 mL premix  Status:  Discontinued        1,000 mg 200 mL/hr over 60 Minutes Intravenous Every 12 hours 01/26/23 0705 01/26/23 1824   01/26/23 1400  ceFEPIme (MAXIPIME) 2 g in sodium chloride 0.9 % 100 mL IVPB  Status:  Discontinued        2 g 200 mL/hr over 30 Minutes Intravenous Every 8 hours 01/26/23 0705 01/27/23 0134   01/26/23 0445  ceFEPIme (MAXIPIME) 2 g in sodium chloride 0.9 % 100 mL IVPB        2 g 200 mL/hr over 30 Minutes Intravenous  Once 01/26/23 0442 01/26/23 0528   01/26/23 0445  metroNIDAZOLE (FLAGYL) IVPB 500 mg        500 mg 100 mL/hr over 60 Minutes Intravenous  Once 01/26/23 0442 01/26/23 0559   01/26/23 0445  vancomycin (VANCOCIN) IVPB 1000 mg/200 mL premix        1,000 mg 200 mL/hr over 60 Minutes Intravenous  Once 01/26/23 0442 01/26/23 0635       Subjective: Seen and examined at bedside and he was still feeling short of breath.  Felt minimally better.  States that he continues to have some rigors but not as much.  No lightheadedness or dizziness.  Feels okay.  No other concerns or complaints this time.  Objective: Vitals:   01/27/23 0505 01/27/23 0600 01/27/23 0610 01/27/23 0700  BP:  134/65  (!) 125/59  Pulse:  86 87 87  Resp: (!) 23 (!) 26 18 (!) 26  Temp:      TempSrc:      SpO2: 98% 99% 100% 100%  Weight:      Height:        Intake/Output Summary (Last 24 hours) at 01/27/2023 0818 Last data filed at 01/27/2023 0700 Gross per 24 hour  Intake  5082.41 ml  Output 1750 ml  Net 3332.41 ml   Filed Weights   01/26/23 0445 01/26/23 1000 01/27/23 0326  Weight: 79.4 kg 77 kg 79.3 kg   Examination: Physical Exam:  Constitutional: WN/WD slightly overweight Caucasian male in no acute distress Respiratory: Diminished to auscultation bilaterally with some coarse breath sounds and some crackles noted.  No appreciable wheezing or rales and no rhonchi. Normal respiratory effort and he is slightly  tachypneic. no accessory muscle use.  Wearing 2 L supplemental oxygen via nasal cannula Cardiovascular: RRR, no murmurs / rubs / gallops. S1 and S2 auscultated. No extremity edema.  Abdomen: Soft, non-tender, non-distended. Bowel sounds positive.  GU: Deferred. Musculoskeletal: No clubbing / cyanosis of digits/nails. No joint deformity upper and lower extremities. .  Skin: No rashes, lesions, ulcers on limited skin evaluation. No induration; Warm and dry.  Neurologic: CN 2-12 grossly intact with no focal deficits. Romberg sign and cerebellar reflexes not assessed.  Psychiatric: Normal judgment and insight. Alert and oriented x 3. Normal mood and appropriate affect.   Data Reviewed: I have personally reviewed following labs and imaging studies  CBC: Recent Labs  Lab 01/26/23 0348 01/26/23 1051 01/27/23 0044  WBC 4.3 12.9* 7.2  NEUTROABS 3.8 12.2*  --   HGB 10.3* 9.4* 8.3*  HCT 29.2* 27.5* 25.1*  MCV 93.6 96.5 98.8  PLT 189 159 0000000*   Basic Metabolic Panel: Recent Labs  Lab 01/26/23 0348 01/26/23 1051 01/27/23 0044  NA 130* 131* 130*  K 4.5 3.8 4.0  CL 98 97* 100  CO2 17* 22 23  GLUCOSE 136* 119* 108*  BUN '19 16 14  '$ CREATININE 0.91 0.75 0.79  CALCIUM 8.7* 8.1* 7.7*   GFR: Estimated Creatinine Clearance: 88.7 mL/min (by C-G formula based on SCr of 0.79 mg/dL). Liver Function Tests: Recent Labs  Lab 01/26/23 0348 01/26/23 1051 01/27/23 0044  AST 95* 83* 69*  ALT 131* 109* 88*  ALKPHOS 172* 175* 164*  BILITOT 0.8 1.1 0.8   PROT 6.7 6.0* 5.3*  ALBUMIN 3.5 2.9* 2.6*   No results for input(s): "LIPASE", "AMYLASE" in the last 168 hours. No results for input(s): "AMMONIA" in the last 168 hours. Coagulation Profile: Recent Labs  Lab 01/26/23 1051  INR 1.4*   Cardiac Enzymes: No results for input(s): "CKTOTAL", "CKMB", "CKMBINDEX", "TROPONINI" in the last 168 hours. BNP (last 3 results) No results for input(s): "PROBNP" in the last 8760 hours. HbA1C: Recent Labs    01/26/23 1622  HGBA1C 5.5   CBG: No results for input(s): "GLUCAP" in the last 168 hours. Lipid Profile: Recent Labs    01/26/23 1432  CHOL 82  HDL 37*  LDLCALC 36  TRIG 45  CHOLHDL 2.2   Thyroid Function Tests: Recent Labs    01/26/23 1051  TSH 1.071   Anemia Panel: Recent Labs    01/26/23 1202  VITAMINB12 1,018*  FOLATE 22.6  FERRITIN 366*  TIBC 192*  IRON 9*  RETICCTPCT 1.0   Sepsis Labs: Recent Labs  Lab 01/26/23 1051 01/26/23 1202 01/26/23 2213 01/27/23 0044  PROCALCITON 27.03  --   --   --   LATICACIDVEN 1.5 1.6 1.1 1.0    Recent Results (from the past 240 hour(s))  Blood Culture (routine x 2)     Status: None (Preliminary result)   Collection Time: 01/26/23  3:48 AM   Specimen: BLOOD  Result Value Ref Range Status   Specimen Description   Final    BLOOD Performed at [facility], [address], [place], [place] [address]    Special Requests   Final    NONE Performed at [facility], [address], [place], [place] [address]    Culture  Setup Time   Final    GRAM NEGATIVE RODS IN BOTH AEROBIC AND ANAEROBIC BOTTLES CRITICAL RESULT CALLED TO, READ BACK BY AND VERIFIED WITH: [place] 01/27/23 @ 0126 BY AB Performed at [facility], [place] [address] [place], [place] [address]    Culture GRAM NEGATIVE RODS  Final   Report Status PENDING  Incomplete  Resp panel by RT-PCR (RSV, Flu A&B, Covid) Anterior Nasal Swab     Status: None    Collection Time: 01/26/23  3:48 AM   Specimen: Anterior Nasal Swab  Result Value Ref Range Status   SARS Coronavirus 2 by RT PCR NEGATIVE NEGATIVE Final    Comment: (NOTE) SARS-CoV-2 target nucleic acids are NOT DETECTED.  The SARS-CoV-2 RNA is generally detectable in upper respiratory specimens during the acute phase of infection. The lowest concentration of SARS-CoV-2 viral copies this assay can detect is 138 copies/mL. A negative result does not preclude SARS-Cov-2 infection and should not be used as the sole basis for treatment or other patient management decisions. A negative result may occur with  improper specimen collection/handling, submission of specimen other than nasopharyngeal swab, presence of viral mutation(s) within the areas targeted by this assay, and inadequate number of viral copies(<138 copies/mL). A negative result must be combined with clinical observations, patient history, and epidemiological information. The expected result is Negative.  Fact Sheet for Patients:  EntrepreneurPulse.com.au  Fact Sheet for Healthcare Providers:  IncredibleEmployment.be  This test is no t yet approved or cleared by the [place] FDA and  has been authorized for detection and/or diagnosis of SARS-CoV-2 by FDA under an Emergency Use Authorization (EUA). This EUA will remain  in effect (meaning this test can be used) for the duration of the COVID-19 declaration under Section 564(b)(1) of the Act, 21 U.S.C.section 360bbb-3(b)(1), unless the authorization is terminated  or revoked sooner.       Influenza A by PCR NEGATIVE NEGATIVE Final   Influenza B by PCR NEGATIVE NEGATIVE Final    Comment: (NOTE) The Xpert Xpress SARS-CoV-2/FLU/RSV plus assay is intended as an aid in the diagnosis of influenza from Nasopharyngeal swab specimens and should not be used as a sole basis for treatment. Nasal washings and aspirates are unacceptable for  Xpert Xpress SARS-CoV-2/FLU/RSV testing.  Fact Sheet for Patients: EntrepreneurPulse.com.au  Fact Sheet for Healthcare Providers: IncredibleEmployment.be  This test is not yet approved or cleared by the [place] FDA and has been authorized for detection and/or diagnosis of SARS-CoV-2 by FDA under an Emergency Use Authorization (EUA). This EUA will remain in effect (meaning this test can be used) for the duration of the COVID-19 declaration under Section 564(b)(1) of the Act, 21 U.S.C. section 360bbb-3(b)(1), unless the authorization is terminated or revoked.     Resp Syncytial Virus by PCR NEGATIVE NEGATIVE Final    Comment: (NOTE) Fact Sheet for Patients: EntrepreneurPulse.com.au  Fact Sheet for Healthcare Providers: IncredibleEmployment.be  This test is not yet approved or cleared by the [place] FDA and has been authorized for detection and/or diagnosis of SARS-CoV-2 by FDA under an Emergency Use Authorization (EUA). This EUA will remain in effect (meaning this test can be used) for the duration of the COVID-19 declaration under Section 564(b)(1) of the Act, 21 U.S.C. section 360bbb-3(b)(1), unless the authorization is terminated or revoked.  Performed at [facility], [address] [place],  [address]   Blood Culture ID Panel (Reflexed)     Status: Abnormal   Collection Time: 01/26/23  3:48 AM  Result Value Ref Range Status   Enterococcus faecalis NOT DETECTED NOT DETECTED Final   Enterococcus Faecium NOT DETECTED NOT DETECTED Final   Listeria monocytogenes NOT DETECTED NOT DETECTED Final  Staphylococcus species NOT DETECTED NOT DETECTED Final   Staphylococcus aureus (BCID) NOT DETECTED NOT DETECTED Final   Staphylococcus epidermidis NOT DETECTED NOT DETECTED Final   Staphylococcus lugdunensis NOT DETECTED NOT DETECTED Final   Streptococcus species NOT  DETECTED NOT DETECTED Final   Streptococcus agalactiae NOT DETECTED NOT DETECTED Final   Streptococcus pneumoniae NOT DETECTED NOT DETECTED Final   Streptococcus pyogenes NOT DETECTED NOT DETECTED Final   A.calcoaceticus-baumannii NOT DETECTED NOT DETECTED Final   Bacteroides fragilis NOT DETECTED NOT DETECTED Final   Enterobacterales DETECTED (A) NOT DETECTED Final    Comment: Enterobacterales represent a large order of gram negative bacteria, not a single organism. CRITICAL RESULT CALLED TO, READ BACK BY AND VERIFIED WITH: PHARMD M. LILLISTON 01/27/23 @ 0126 BY AB    Enterobacter cloacae complex NOT DETECTED NOT DETECTED Final   Escherichia coli DETECTED (A) NOT DETECTED Final    Comment: CRITICAL RESULT CALLED TO, READ BACK BY AND VERIFIED WITH: PHARMD M. LILLISTON 01/27/23 @ 0126 BY AB    Klebsiella aerogenes NOT DETECTED NOT DETECTED Final   Klebsiella oxytoca NOT DETECTED NOT DETECTED Final   Klebsiella pneumoniae NOT DETECTED NOT DETECTED Final   Proteus species NOT DETECTED NOT DETECTED Final   Salmonella species NOT DETECTED NOT DETECTED Final   Serratia marcescens NOT DETECTED NOT DETECTED Final   Haemophilus influenzae NOT DETECTED NOT DETECTED Final   Neisseria meningitidis NOT DETECTED NOT DETECTED Final   Pseudomonas aeruginosa NOT DETECTED NOT DETECTED Final   Stenotrophomonas maltophilia NOT DETECTED NOT DETECTED Final   Candida albicans NOT DETECTED NOT DETECTED Final   Candida auris NOT DETECTED NOT DETECTED Final   Candida glabrata NOT DETECTED NOT DETECTED Final   Candida krusei NOT DETECTED NOT DETECTED Final   Candida parapsilosis NOT DETECTED NOT DETECTED Final   Candida tropicalis NOT DETECTED NOT DETECTED Final   Cryptococcus neoformans/gattii NOT DETECTED NOT DETECTED Final   CTX-M ESBL DETECTED (A) NOT DETECTED Final    Comment: CRITICAL RESULT CALLED TO, READ BACK BY AND VERIFIED WITH: PHARMD M. LILLISTON 01/27/23 @ 0126 BY AB (NOTE) Extended spectrum  beta-lactamase detected. Recommend a carbapenem as initial therapy.      Carbapenem resistance IMP NOT DETECTED NOT DETECTED Final   Carbapenem resistance KPC NOT DETECTED NOT DETECTED Final   Carbapenem resistance NDM NOT DETECTED NOT DETECTED Final   Carbapenem resist OXA 48 LIKE NOT DETECTED NOT DETECTED Final   Carbapenem resistance VIM NOT DETECTED NOT DETECTED Final    Comment: Performed at [facility], [place] [address], [place], [place] [address]  Blood Culture (routine x 2)     Status: None (Preliminary result)   Collection Time: 01/26/23  3:59 AM   Specimen: BLOOD  Result Value Ref Range Status   Specimen Description   Final    BLOOD Performed at [facility], [address], [place], [place] [address]    Special Requests   Final    NONE Performed at [facility], [address] [place], [place] [address]    Culture  Setup Time   Final    GRAM NEGATIVE RODS AEROBIC BOTTLE ONLY CRITICAL VALUE NOTED.  VALUE IS CONSISTENT WITH PREVIOUSLY REPORTED AND CALLED VALUE. Performed at [facility], [place] [address], [place], [place] [address]    Culture PENDING  Incomplete   Report Status PENDING  Incomplete  MRSA Next Gen by PCR, Nasal     Status: None   Collection Time: 01/26/23 10:00  AM   Specimen: Nasal Mucosa; Nasal Swab  Result Value Ref Range Status   MRSA by PCR Next Gen NOT DETECTED NOT DETECTED Final    Comment: (NOTE) The GeneXpert MRSA Assay (FDA approved for NASAL specimens only), is one component of a comprehensive MRSA colonization surveillance program. It is not intended to diagnose MRSA infection nor to guide or monitor treatment for MRSA infections. Test performance is not FDA approved in patients less than 1 years old. Performed at [facility], [place] [address], [place],  [address]     Radiology Studies: CT Angio Chest Pulmonary Embolism (PE) W or WO Contrast  Result Date:  01/26/2023 CLINICAL DATA:  High probability for PE. Status post prostatectomy. Shortness of breath and fever. EXAM: CT ANGIOGRAPHY CHEST WITH CONTRAST TECHNIQUE: Multidetector CT imaging of the chest was performed using the standard protocol during bolus administration of intravenous contrast. Multiplanar CT image reconstructions and MIPs were obtained to evaluate the vascular anatomy. RADIATION DOSE REDUCTION: This exam was performed according to the departmental dose-optimization program which includes automated exposure control, adjustment of the mA and/or kV according to patient size and/or use of iterative reconstruction technique. CONTRAST:  26m OMNIPAQUE IOHEXOL 350 MG/ML SOLN COMPARISON:  CT abdomen and pelvis same day FINDINGS: Cardiovascular: Satisfactory opacification of the pulmonary arteries to the segmental level. No evidence of pulmonary embolism. Normal heart size. No pericardial effusion. There are atherosclerotic calcifications of the aorta. Mediastinum/Nodes: There are enlarged paratracheal lymph nodes measuring up to 10 mm short axis. Enlarged subcarinal lymph node measures 12 mm. Enlarged right hilar lymph node measures 15 mm. Enlarged left hilar lymph node measures 12 mm. Esophagus is within normal limits. There is a small hiatal hernia. Visualized thyroid gland is within normal limits. Lungs/Pleura: There are trace bilateral pleural effusions. There some scattered patchy ground-glass opacities in both mid and lower lungs with some smooth interlobular septal thickening in the lung bases. Trachea and central airways are patent. No pneumothorax. Upper Abdomen: No acute abnormality. Musculoskeletal: There is mild compression deformity of the superior endplate of T7 which is age indeterminate. Review of the MIP images confirms the above findings. IMPRESSION: 1. No evidence for pulmonary embolism. 2. Trace bilateral pleural effusions. 3. Patchy ground-glass opacities in the mid and lower lungs with  smooth interlobular septal thickening in the lung bases worrisome for pulmonary edema. 4. Mediastinal and bilateral hilar lymphadenopathy of uncertain etiology. 5. Age-indeterminate T7 compression deformity. Aortic Atherosclerosis (ICD10-I70.0). Electronically Signed   By: ARonney AstersM.D.   On: 01/26/2023 21:33   CT Abdomen Pelvis W Contrast  Result Date: 01/26/2023 CLINICAL DATA:  Pain and fever after prostatectomy. EXAM: CT ABDOMEN AND PELVIS WITH CONTRAST TECHNIQUE: Multidetector CT imaging of the abdomen and pelvis was performed using the standard protocol following bolus administration of intravenous contrast. RADIATION DOSE REDUCTION: This exam was performed according to the departmental dose-optimization program which includes automated exposure control, adjustment of the mA and/or kV according to patient size and/or use of iterative reconstruction technique. CONTRAST:  1012mOMNIPAQUE IOHEXOL 300 MG/ML  SOLN COMPARISON:  None Available. FINDINGS: Lower chest: No acute abnormality. Hepatobiliary: No focal liver abnormality is seen. No gallstones, gallbladder wall thickening, or biliary dilatation. Pancreas: Unremarkable. No pancreatic ductal dilatation or surrounding inflammatory changes. Spleen: Normal in size without focal abnormality. Adrenals/Urinary Tract: Normal adrenal glands. Bosniak class 1 and 2 kidney cysts identified. The largest arises off the upper pole of the left kidney measuring 1.4 cm. No follow-up imaging recommended.  Bilateral striated nephrograms identified concerning for pyelonephritis. No nephrolithiasis or hydronephrosis. Urinary bladder is unremarkable. Stomach/Bowel: Small hiatal hernia. The appendix is visualized and is within normal limits. Sigmoid diverticulosis without signs of acute diverticulitis. No bowel wall thickening, inflammation or distension. Vascular/Lymphatic: Aortic atherosclerosis. No abdominopelvic adenopathy. Reproductive: Status post prostatectomy. No  abnormal fluid collections identified within the prostate bed. Other: Small volume of free fluid is identified within the pelvis. No focal fluid collections. Musculoskeletal: No acute or significant osseous findings. IMPRESSION: 1. Bilateral striated nephrograms concerning for acute pyelonephritis. 2. Status post prostatectomy. No abnormal fluid collections identified within the prostate bed. 3. Small volume of free fluid is identified within the pelvis. Nonspecific in the early postoperative time frame. 4. Small hiatal hernia. 5.  Aortic Atherosclerosis (ICD10-I70.0). Electronically Signed   By: Kerby Moors M.D.   On: 01/26/2023 05:14   DG Chest Port 1 View  Result Date: 01/26/2023 CLINICAL DATA:  72 year old male with possible sepsis. Shortness of breath. EXAM: PORTABLE CHEST 1 VIEW COMPARISON:  None Available. FINDINGS: Portable AP view at 0403 hours. Normal lung volumes and mediastinal contours. Visualized tracheal air column is within normal limits. Allowing for portable technique the lungs are clear. No pneumothorax or pleural effusion. Paucity of bowel gas in the visible abdomen. No acute osseous abnormality identified. IMPRESSION: Negative portable chest. Electronically Signed   By: Genevie Ann M.D.   On: 01/26/2023 04:15    Scheduled Meds:  aspirin EC  81 mg Oral Daily   Chlorhexidine Gluconate Cloth  6 each Topical Daily   Continuous Infusions:  sodium chloride 50 mL/hr at 01/27/23 0700   heparin 1,200 Units/hr (01/27/23 0700)   meropenem (MERREM) IV Stopped (01/27/23 0243)    LOS: 1 day   Raiford Noble, DO [facility] Available via Epic secure chat 7am-7pm After these hours, please refer to coverage provider listed on [email or web address] 01/27/2023, 8:18 AM

## 2023-01-28 ENCOUNTER — Inpatient Hospital Stay (HOSPITAL_COMMUNITY): Payer: PPO

## 2023-01-28 DIAGNOSIS — A419 Sepsis, unspecified organism: Secondary | ICD-10-CM | POA: Diagnosis not present

## 2023-01-28 DIAGNOSIS — A498 Other bacterial infections of unspecified site: Secondary | ICD-10-CM

## 2023-01-28 DIAGNOSIS — E871 Hypo-osmolality and hyponatremia: Secondary | ICD-10-CM

## 2023-01-28 DIAGNOSIS — D649 Anemia, unspecified: Secondary | ICD-10-CM

## 2023-01-28 DIAGNOSIS — N12 Tubulo-interstitial nephritis, not specified as acute or chronic: Secondary | ICD-10-CM

## 2023-01-28 DIAGNOSIS — I214 Non-ST elevation (NSTEMI) myocardial infarction: Secondary | ICD-10-CM

## 2023-01-28 DIAGNOSIS — N390 Urinary tract infection, site not specified: Secondary | ICD-10-CM

## 2023-01-28 DIAGNOSIS — R0902 Hypoxemia: Secondary | ICD-10-CM

## 2023-01-28 DIAGNOSIS — I2489 Other forms of acute ischemic heart disease: Secondary | ICD-10-CM

## 2023-01-28 DIAGNOSIS — E8770 Fluid overload, unspecified: Secondary | ICD-10-CM | POA: Diagnosis not present

## 2023-01-28 DIAGNOSIS — K219 Gastro-esophageal reflux disease without esophagitis: Secondary | ICD-10-CM

## 2023-01-28 LAB — COMPREHENSIVE METABOLIC PANEL
ALT: 68 U/L — ABNORMAL HIGH (ref 0–44)
AST: 49 U/L — ABNORMAL HIGH (ref 15–41)
Albumin: 2.7 g/dL — ABNORMAL LOW (ref 3.5–5.0)
Alkaline Phosphatase: 161 U/L — ABNORMAL HIGH (ref 38–126)
Anion gap: 8 (ref 5–15)
BUN: 12 mg/dL (ref 8–23)
CO2: 23 mmol/L (ref 22–32)
Calcium: 7.8 mg/dL — ABNORMAL LOW (ref 8.9–10.3)
Chloride: 97 mmol/L — ABNORMAL LOW (ref 98–111)
Creatinine, Ser: 0.8 mg/dL (ref 0.61–1.24)
GFR, Estimated: >60 mL/min (ref >60)
Glucose, Bld: 107 mg/dL — ABNORMAL HIGH (ref 70–99)
Potassium: 3.5 mmol/L (ref 3.5–5.1)
Sodium: 128 mmol/L — ABNORMAL LOW (ref 135–145)
Total Bilirubin: 1 mg/dL (ref 0.3–1.2)
Total Protein: 5.5 g/dL — ABNORMAL LOW (ref 6.5–8.1)

## 2023-01-28 LAB — LIPOPROTEIN A (LPA): Lipoprotein (a): 21.5 nmol/L (ref <75.0)

## 2023-01-28 LAB — CBC WITH DIFFERENTIAL/PLATELET
Abs Immature Granulocytes: 0.02 10*3/uL (ref 0.00–0.07)
Basophils Absolute: 0 10*3/uL (ref 0.0–0.1)
Basophils Relative: 0 %
Eosinophils Absolute: 0 10*3/uL (ref 0.0–0.5)
Eosinophils Relative: 1 %
HCT: 26 % — ABNORMAL LOW (ref 39.0–52.0)
Hemoglobin: 8.9 g/dL — ABNORMAL LOW (ref 13.0–17.0)
Immature Granulocytes: 0 %
Lymphocytes Relative: 15 %
Lymphs Abs: 0.8 10*3/uL (ref 0.7–4.0)
MCH: 32.8 pg (ref 26.0–34.0)
MCHC: 34.2 g/dL (ref 30.0–36.0)
MCV: 95.9 fL (ref 80.0–100.0)
Monocytes Absolute: 0.6 10*3/uL (ref 0.1–1.0)
Monocytes Relative: 12 %
Neutro Abs: 3.7 10*3/uL (ref 1.7–7.7)
Neutrophils Relative %: 72 %
Platelets: 136 10*3/uL — ABNORMAL LOW (ref 150–400)
RBC: 2.71 MIL/uL — ABNORMAL LOW (ref 4.22–5.81)
RDW: 11.8 % (ref 11.5–15.5)
WBC: 5.1 10*3/uL (ref 4.0–10.5)
nRBC: 0 % (ref 0.0–0.2)

## 2023-01-28 LAB — MAGNESIUM: Magnesium: 1.9 mg/dL (ref 1.7–2.4)

## 2023-01-28 LAB — HEPARIN LEVEL (UNFRACTIONATED): Heparin Unfractionated: 0.32 IU/mL (ref 0.30–0.70)

## 2023-01-28 LAB — GLUCOSE, CAPILLARY: Glucose-Capillary: 98 mg/dL (ref 70–99)

## 2023-01-28 LAB — URINE CULTURE: Culture: 50000 — AB

## 2023-01-28 LAB — PHOSPHORUS: Phosphorus: 3.2 mg/dL (ref 2.5–4.6)

## 2023-01-28 NOTE — Progress Notes (Addendum)
Was called by RN that patient had a bout where he was not feeling too well, noted to be short of breath with some complaints of right hand being significantly colder than left. Patient noted on telemetry to have a dropped beat.  EKG with no significant ischemic changes and similar to prior EKG done this morning. General: NAD. Respiratory: Lungs clear to auscultation bilaterally.  No wheezes, no crackles, no rhonchi.  Fair air movement.  Speaking in full sentences.  No use of accessory muscles of respiration. Cardiovascular: Regular rate rhythm no murmurs rubs or gallops.  No JVD.  No lower extremity edema. Extremities: No clubbing cyanosis or edema.  Assessment/plan 1.  Hypoxia -Patient noted to have a bout of hypoxia per RN with sats dropping momentarily however patient satting 100% on room air. -EKG with no significant ischemic changes noted.  Some Q waves noted, EKG with no significant change since EKG this morning. -Telemetry monitor with some concerns of a missed beat. -Check a chest x-ray. -Follow.

## 2023-01-28 NOTE — Progress Notes (Signed)
1700 pt reported feeling "bad" overall and experiencing labored breathing. Wife of patient reported that the patients right hand was significantly colder than the left hand. Donnie Mesa RN

## 2023-01-28 NOTE — Progress Notes (Signed)
Cardiology Progress Note  Patient ID: Samuel Jester. MRN: [id number] DOB: 02/13/1951 Date of Encounter: 01/28/2023  Primary Cardiologist: Freada Bergeron, MD  Subjective   Chief Complaint: None.   HPI: Denies chest pain or trouble breathing.  ROS:  All other ROS reviewed and negative. Pertinent positives noted in the HPI.     Inpatient Medications  Scheduled Meds:  aspirin EC  81 mg Oral Daily   Chlorhexidine Gluconate Cloth  6 each Topical Daily   pantoprazole  40 mg Oral Daily   rosuvastatin  5 mg Oral Daily   Continuous Infusions:  heparin 1,350 Units/hr (01/28/23 0648)   meropenem (MERREM) IV 1 g (01/28/23 0930)   PRN Meds: acetaminophen **OR** acetaminophen, albuterol, magnesium hydroxide, nitroGLYCERIN, ondansetron **OR** ondansetron (ZOFRAN) IV   Vital Signs   Vitals:   01/28/23 0700 01/28/23 0730 01/28/23 0754 01/28/23 0800  BP: 114/72   111/67  Pulse: 67 69  75  Resp: 20 (!) 24  (!) 22  Temp:   98.2 F (36.8 C)   TempSrc:   Oral   SpO2: 98% 97%  97%  Weight:      Height:        Intake/Output Summary (Last 24 hours) at 01/28/2023 1024 Last data filed at 01/28/2023 0648 Gross per 24 hour  Intake 1071.37 ml  Output 475 ml  Net 596.37 ml      01/28/2023    5:00 AM 01/27/2023    3:26 AM 01/26/2023   10:00 AM  Last 3 Weights  Weight (lbs) 170 lb 10.2 oz 174 lb 13.2 oz 169 lb 12.1 oz  Weight (kg) 77.4 kg 79.3 kg 77 kg      Telemetry  Overnight telemetry shows sinus rhythm 60s, which I personally reviewed.   ECG  The most recent ECG shows sinus rhythm heart rate 83, no acute ischemic changes, which I personally reviewed.   Physical Exam   Vitals:   01/28/23 0700 01/28/23 0730 01/28/23 0754 01/28/23 0800  BP: 114/72   111/67  Pulse: 67 69  75  Resp: 20 (!) 24  (!) 22  Temp:   98.2 F (36.8 C)   TempSrc:   Oral   SpO2: 98% 97%  97%  Weight:      Height:        Intake/Output Summary (Last 24 hours) at 01/28/2023 1024 Last data  filed at 01/28/2023 0648 Gross per 24 hour  Intake 1071.37 ml  Output 475 ml  Net 596.37 ml       01/28/2023    5:00 AM 01/27/2023    3:26 AM 01/26/2023   10:00 AM  Last 3 Weights  Weight (lbs) 170 lb 10.2 oz 174 lb 13.2 oz 169 lb 12.1 oz  Weight (kg) 77.4 kg 79.3 kg 77 kg    Body mass index is 24.48 kg/m.  General: Well nourished, well developed, in no acute distress Head: Atraumatic, normal size  Eyes: PEERLA, EOMI  Neck: Supple, no JVD Endocrine: No thryomegaly Cardiac: Normal S1, S2; RRR; no murmurs, rubs, or gallops Lungs: Clear to auscultation bilaterally, no wheezing, rhonchi or rales  Abd: Soft, nontender, no hepatomegaly  Ext: No edema, pulses 2+ Musculoskeletal: No deformities, BUE and BLE strength normal and equal Skin: Warm and dry, no rashes   Neuro: Alert and oriented to person, place, time, and situation, CNII-XII grossly intact, no focal deficits  Psych: Normal mood and affect   Labs  High Sensitivity Troponin:   Recent Labs  Lab 01/26/23 1202 01/26/23 1432 01/26/23 1622 01/26/23 2043  TROPONINIHS 1,432* 1,522* 1,438* 1,591*     Cardiac EnzymesNo results for input(s): "TROPONINI" in the last 168 hours. No results for input(s): "TROPIPOC" in the last 168 hours.  Chemistry Recent Labs  Lab 01/26/23 1051 01/27/23 0044 01/28/23 0311  NA 131* 130* 128*  K 3.8 4.0 3.5  CL 97* 100 97*  CO2 '22 23 23  '$ GLUCOSE 119* 108* 107*  BUN '16 14 12  '$ CREATININE 0.75 0.79 0.80  CALCIUM 8.1* 7.7* 7.8*  PROT 6.0* 5.3* 5.5*  ALBUMIN 2.9* 2.6* 2.7*  AST 83* 69* 49*  ALT 109* 88* 68*  ALKPHOS 175* 164* 161*  BILITOT 1.1 0.8 1.0  GFRNONAA >60 >60 >60  ANIONGAP '12 7 8    '$ Hematology Recent Labs  Lab 01/26/23 1051 01/26/23 1202 01/27/23 0044 01/28/23 0311  WBC 12.9*  --  7.2 5.1  RBC 2.85* 2.79* 2.54* 2.71*  HGB 9.4*  --  8.3* 8.9*  HCT 27.5*  --  25.1* 26.0*  MCV 96.5  --  98.8 95.9  MCH 33.0  --  32.7 32.8  MCHC 34.2  --  33.1 34.2  RDW 11.9  --  12.0 11.8   PLT 159  --  137* 136*   BNP Recent Labs  Lab 01/26/23 1051 01/26/23 2213  BNP 269.7* 849.4*    DDimer No results for input(s): "DDIMER" in the last 168 hours.   Radiology  ECHOCARDIOGRAM COMPLETE  Result Date: 01/27/2023    ECHOCARDIOGRAM REPORT   Patient Name:   Adrick Schneiders. Date of Exam: 01/27/2023 Medical Rec #:  [id number]               Height:       70.0 in Accession #:    [id number]              Weight:       174.8 lb Date of Birth:  Dec 30, 1950               BSA:          1.971 m Patient Age:    14 years                BP:           125/59 mmHg Patient Gender: M                       HR:           87 bpm. Exam Location:  Inpatient Procedure: 2D Echo, 3D Echo, Color Doppler and Cardiac Doppler Indications:     dyspnea  History:         Patient has no prior history of Echocardiogram examinations.  Sonographer:     Harvie Junior Referring Phys:  [id number] [place] Diagnosing Phys: Dixie Dials MD IMPRESSIONS  1. Left ventricular ejection fraction, by estimation, is 55 to 60%. The left ventricle has normal function. The left ventricle has no regional wall motion abnormalities. Left ventricular diastolic parameters are consistent with Grade I diastolic dysfunction (impaired relaxation).  2. Right ventricular systolic function is normal. The right ventricular size is normal. There is moderately elevated pulmonary artery systolic pressure.  3. Left atrial size was mildly dilated.  4. Right atrial size was mildly dilated.  5. The mitral valve is normal in structure. Mild mitral valve regurgitation.  6. The aortic valve is normal in structure. Aortic valve regurgitation is  not visualized.  7. There is mild (Grade II) atheroma plaque involving the ascending aorta.  8. The inferior vena cava is normal in size with greater than 50% respiratory variability, suggesting right atrial pressure of 3 mmHg. FINDINGS  Left Ventricle: Left ventricular ejection fraction, by estimation, is 55 to 60%.  The left ventricle has normal function. The left ventricle has no regional wall motion abnormalities. The left ventricular internal cavity size was normal in size. There is  no left ventricular hypertrophy. Left ventricular diastolic parameters are consistent with Grade I diastolic dysfunction (impaired relaxation). Right Ventricle: The right ventricular size is normal. No increase in right ventricular wall thickness. Right ventricular systolic function is normal. There is moderately elevated pulmonary artery systolic pressure. The tricuspid regurgitant velocity is 3.49 m/s, and with an assumed right atrial pressure of 3 mmHg, the estimated right ventricular systolic pressure is [id number] mmHg. Left Atrium: Left atrial size was mildly dilated. Right Atrium: Right atrial size was mildly dilated. Pericardium: There is no evidence of pericardial effusion. Mitral Valve: The mitral valve is normal in structure. Mild mitral valve regurgitation. Tricuspid Valve: The tricuspid valve is normal in structure. Tricuspid valve regurgitation is mild. Aortic Valve: The aortic valve is normal in structure. Aortic valve regurgitation is not visualized. Aortic valve mean gradient measures 3.0 mmHg. Aortic valve peak gradient measures 5.6 mmHg. Aortic valve area, by VTI measures 3.05 cm. Pulmonic Valve: The pulmonic valve was normal in structure. Pulmonic valve regurgitation is not visualized. Aorta: The aortic root is normal in size and structure. There is mild (Grade II) atheroma plaque involving the ascending aorta. Venous: The inferior vena cava is normal in size with greater than 50% respiratory variability, suggesting right atrial pressure of 3 mmHg. IAS/Shunts: The atrial septum is grossly normal.  LEFT VENTRICLE PLAX 2D LVIDd:         5.40 cm      Diastology LVIDs:         3.50 cm      LV e' medial:    13.20 cm/s LV PW:         1.00 cm      LV E/e' medial:  5.2 LV IVS:        1.00 cm      LV e' lateral:   16.50 cm/s LVOT diam:      2.20 cm      LV E/e' lateral: 4.2 LV SV:         59 LV SV Index:   30 LVOT Area:     3.80 cm                              3D Volume EF: LV Volumes (MOD)            3D EF:        63 % LV vol d, MOD A2C: 123.0 ml LV EDV:       154 ml LV vol d, MOD A4C: 107.0 ml LV ESV:       57 ml LV vol s, MOD A2C: 44.0 ml  LV SV:        96 ml LV vol s, MOD A4C: 42.7 ml LV SV MOD A2C:     79.0 ml LV SV MOD A4C:     107.0 ml LV SV MOD BP:      73.3 ml RIGHT VENTRICLE RV Basal diam:  4.10 cm RV Mid diam:  3.30 cm RV S prime:     14.90 cm/s TAPSE (M-mode): 2.2 cm LEFT ATRIUM           Index        RIGHT ATRIUM           Index LA diam:      3.30 cm 1.67 cm/m   RA Area:     15.00 cm LA Vol (A4C): 57.4 ml 29.12 ml/m  RA Volume:   40.20 ml  20.39 ml/m  AORTIC VALVE                    PULMONIC VALVE AV Area (Vmax):    3.10 cm     PV Vmax:          1.03 m/s AV Area (Vmean):   3.10 cm     PV Peak grad:     4.2 mmHg AV Area (VTI):     3.05 cm     PR End Diast Vel: 5.86 msec AV Vmax:           118.00 cm/s AV Vmean:          78.200 cm/s AV VTI:            0.192 m AV Peak Grad:      5.6 mmHg AV Mean Grad:      3.0 mmHg LVOT Vmax:         96.30 cm/s LVOT Vmean:        63.800 cm/s LVOT VTI:          0.154 m LVOT/AV VTI ratio: 0.80  AORTA Ao Root diam: 3.70 cm Ao Asc diam:  3.10 cm MITRAL VALVE               TRICUSPID VALVE MV Area (PHT): 4.06 cm    TR Peak grad:   48.7 mmHg MV Decel Time: 187 msec    TR Vmax:        349.00 cm/s MR Peak grad: 49.6 mmHg MR Vmax:      352.00 cm/s  SHUNTS MV E velocity: 68.60 cm/s  Systemic VTI:  0.15 m MV A velocity: 77.50 cm/s  Systemic Diam: 2.20 cm MV E/A ratio:  0.89 Dixie Dials MD Electronically signed by Dixie Dials MD Signature Date/Time: 01/27/2023/5:18:56 PM    Final    CT Angio Chest Pulmonary Embolism (PE) W or WO Contrast  Result Date: 01/26/2023 CLINICAL DATA:  High probability for PE. Status post prostatectomy. Shortness of breath and fever. EXAM: CT ANGIOGRAPHY CHEST WITH CONTRAST  TECHNIQUE: Multidetector CT imaging of the chest was performed using the standard protocol during bolus administration of intravenous contrast. Multiplanar CT image reconstructions and MIPs were obtained to evaluate the vascular anatomy. RADIATION DOSE REDUCTION: This exam was performed according to the departmental dose-optimization program which includes automated exposure control, adjustment of the mA and/or kV according to patient size and/or use of iterative reconstruction technique. CONTRAST:  81m OMNIPAQUE IOHEXOL 350 MG/ML SOLN COMPARISON:  CT abdomen and pelvis same day FINDINGS: Cardiovascular: Satisfactory opacification of the pulmonary arteries to the segmental level. No evidence of pulmonary embolism. Normal heart size. No pericardial effusion. There are atherosclerotic calcifications of the aorta. Mediastinum/Nodes: There are enlarged paratracheal lymph nodes measuring up to 10 mm short axis. Enlarged subcarinal lymph node measures 12 mm. Enlarged right hilar lymph node measures 15 mm. Enlarged left hilar lymph node measures 12 mm. Esophagus is within normal limits. There is a small hiatal hernia. Visualized  thyroid gland is within normal limits. Lungs/Pleura: There are trace bilateral pleural effusions. There some scattered patchy ground-glass opacities in both mid and lower lungs with some smooth interlobular septal thickening in the lung bases. Trachea and central airways are patent. No pneumothorax. Upper Abdomen: No acute abnormality. Musculoskeletal: There is mild compression deformity of the superior endplate of T7 which is age indeterminate. Review of the MIP images confirms the above findings. IMPRESSION: 1. No evidence for pulmonary embolism. 2. Trace bilateral pleural effusions. 3. Patchy ground-glass opacities in the mid and lower lungs with smooth interlobular septal thickening in the lung bases worrisome for pulmonary edema. 4. Mediastinal and bilateral hilar lymphadenopathy of uncertain  etiology. 5. Age-indeterminate T7 compression deformity. Aortic Atherosclerosis (ICD10-I70.0). Electronically Signed   By: Ronney Asters M.D.   On: 01/26/2023 21:33    Cardiac Studies  TTE 01/27/2023  1. Left ventricular ejection fraction, by estimation, is 55 to 60%. The  left ventricle has normal function. The left ventricle has no regional  wall motion abnormalities. Left ventricular diastolic parameters are  consistent with Grade I diastolic  dysfunction (impaired relaxation).   2. Right ventricular systolic function is normal. The right ventricular  size is normal. There is moderately elevated pulmonary artery systolic  pressure.   3. Left atrial size was mildly dilated.   4. Right atrial size was mildly dilated.   5. The mitral valve is normal in structure. Mild mitral valve  regurgitation.   6. The aortic valve is normal in structure. Aortic valve regurgitation is  not visualized.   7. There is mild (Grade II) atheroma plaque involving the ascending  aorta.   8. The inferior vena cava is normal in size with greater than 50%  respiratory variability, suggesting right atrial pressure of 3 mmHg.   Patient Profile  Sheraz Sachar. is a 72 y.o. male with hyperlipidemia and GERD who was admitted on 01/26/2023 for sepsis secondary to pyelonephritis.  Course complicated by E. coli bacteremia and non-STEMI.  Assessment & Plan   # Non-STEMI -Suspect this is non-MI troponin elevation in the setting of sepsis.  This is a demand pattern.  His EKG is nonischemic.  He is without chest pains or trouble breathing.  CTA PE study negative.  Minimal coronary calcifications. -Echo normal.  No wall motion normality -Plan per cardiology yesterday was to complete 48 hours of heparin.  I think this is reasonable. -Continue aspirin and statin. -He really is without cardiac complaints.  Can follow-up for outpatient coronary CTA with Dr. Johney Frame.  We will arrange this follow-up.  Suspect he has  several days more in the hospital to recover from sepsis.  # Volume overload -Iatrogenic from sepsis fluid resuscitation.  Received Lasix.  Euvolemic on exam.  [facility] will sign off.   Medication Recommendations: 48 hours of heparin.  Aspirin and statin.  No need for beta-blocker. Other recommendations (labs, testing, etc): None Follow up as an outpatient: 4 to 6 weeks with Dr. Johney Frame  For questions or updates, please contact [place] Please consult [email or web address] for contact info under        Signed, [place] B[place] T. Audie Box, MD, [facility]  01/28/2023 10:24 AM

## 2023-01-28 NOTE — Consult Note (Signed)
[facility]                                                                                        Patient Identification: Patient Name: Trevor Wilson. MRN: [id number] Admit Date: 01/26/2023  3:37 AM Today's Date: 01/28/2023 Reason for consult: GNR bacteremia  Requesting provider: Dr Grandville Silos  Principal Problem:   Sepsis secondary to UTI [facility])   Antibiotics:  Vancomycin 3/10-3/11 Metronidazole 3/10 Cefepime 3/10-3/11, meropenem 3/12-c  Lines/Hardware:  Assessment 72 year old male with PMH as below including recent laparoscopic radical prostatectomy with pelvic lymph node dissection on 01/07/23 for prostate cancer who presented to the ED on 3/11 with worsening shortness of breath and fevers associated with chills and rigirs for last few days.   # Pyelonephritis  # ESBL E coli bacteremia  # Acute hypoxic respiratory failure with Pul edema - primary managing . TTE 3/12 with no infective concerns  # Transaminitis - in the setting of sepsis and improving   Recommendations  Continue meropenem  Will fu sensi of ESBL E coli for final abtx plan Monitor CBC and CMP D/w ID Pharm D Following  Rest of the management as per the primary team. Please call with questions or concerns.  Thank you for the consult  Rosiland Oz, MD Infectious Disease Physician [facility] [address] [place], [place] [address] Phone: [phone]  Fax: [phone]  __________________________________________________________________________________________________________ HPI and Hospital Course  72 year old male with PMH as below including recent laparoscopic radical prostatectomy with pelvic lymph node dissection on 01/07/23 for prostate cancer who presented to the ED on 3/11 with worsening shortness of breath and fevers associated with chills and  rigirs for last few days.  Spoke with patient and wife at bedside. Doing well after prostate surgery until few days ago. Denies any nausea, vomiting or diarrhea or GU symptoms.  Denies any cough. Denies smoking and IVDU, quit alcohol 2 years ago.   At ED febrile and tachycardic Labs remarkable for WBC 4.3, ALP 172, AST 95 and ALT 131, lactic acid 4.6 3/11 UA with pyuria, bactyerriuria urine culture with 50,000 E. Coli 3/11 blood cultures 2/2 sets GNR, ID as E. coli with CTX gene positive CT 3/11 with bilateral striated nephrogram concerning for acute pyelonephritis.  Status post prostatectomy with no abnormal fluid collections identified within the prostate bed   ROS: General- Denies loss of appetite and loss of weight. Had fevers and chills + HEENT - Denies headache, blurry vision, neck pain, sinus pain Chest - Denies any chest pain, cough, SOB + CVS- Denies any dizziness/lightheadedness, syncopal attacks, palpitations Abdomen- Denies any nausea, abdominal pain, hematochezia and diarrhea. 1 episode of vomiting has resolved  Neuro - Denies any weakness, numbness, tingling sensation Psych - Denies any changes in mood irritability or depressive symptoms GU- Denies any burning, dysuria, hematuria or increased frequency of urination Skin - denies any rashes/lesions MSK - denies any joint pain/swelling or restricted ROM   Past Medical History:  Diagnosis Date   Arthritis    ED (erectile dysfunction) due to arterial insufficiency 05/05/2022  11/05/2021   GERD (gastroesophageal reflux disease)    Prostate cancer ([place]) 10/23/2022   05/05/2022, 11/05/2021   Urinary urgency 05/05/2022   11/05/2021   Past Surgical History:  Procedure Laterality Date   BACK SURGERY     LYMPH NODE DISSECTION Bilateral 01/07/2023   Procedure: PELVIC LYMPH NODE DISSECTION;  Surgeon: Alexis Frock, MD;  Location: [facility];  Service: Urology;  Laterality: Bilateral;   ROBOT ASSISTED LAPAROSCOPIC RADICAL  PROSTATECTOMY N/A 01/07/2023   Procedure: XI ROBOTIC ASSISTED LAPAROSCOPIC RADICAL PROSTATECTOMY AND INDOCYANINE GREEN DYE INJECTION;  Surgeon: Alexis Frock, MD;  Location: [facility];  Service: Urology;  Laterality: N/A;  3 HRS   Surgical pathology, Gross and Microscopic examination for prosate needle  10/23/2022     Scheduled Meds:  aspirin EC  81 mg Oral Daily   Chlorhexidine Gluconate Cloth  6 each Topical Daily   pantoprazole  40 mg Oral Daily   rosuvastatin  5 mg Oral Daily   Continuous Infusions:  heparin 1,350 Units/hr (01/28/23 0648)   meropenem (MERREM) IV Stopped (01/28/23 0223)   PRN Meds:.acetaminophen **OR** acetaminophen, albuterol, magnesium hydroxide, nitroGLYCERIN, ondansetron **OR** ondansetron (ZOFRAN) IV  No Known Allergies  Social History   Socioeconomic History   Marital status: Married    Spouse name: Not on file   Number of children: Not on file   Years of education: Not on file   Highest education level: Not on file  Occupational History   Not on file  Tobacco Use   Smoking status: Never   Smokeless tobacco: Never  Vaping Use   Vaping Use: Never used  Substance and Sexual Activity   Alcohol use: Not Currently    Comment: Has 2 driniks per month.   Drug use: Never   Sexual activity: Not on file  Other Topics Concern   Not on file  Social History Narrative   Not on file   Social Determinants of Health   Financial Resource Strain: Not on file  Food Insecurity: No Food Insecurity (01/26/2023)   Hunger Vital Sign    Worried About Running Out of Food in the Last Year: Never true    Ran Out of Food in the Last Year: Never true  Transportation Needs: No Transportation Needs (01/26/2023)   PRAPARE - Hydrologist (Medical): No    Lack of Transportation (Non-Medical): No  Physical Activity: Not on file  Stress: Not on file  Social Connections: Not on file  Intimate Partner Violence: Not At Risk (01/26/2023)    Humiliation, Afraid, Rape, and Kick questionnaire    Fear of Current or Ex-Partner: No    Emotionally Abused: No    Physically Abused: No    Sexually Abused: No   History reviewed. No pertinent family history.  Vitals BP 114/72   Pulse 69   Temp 98.2 F (36.8 C) (Oral)   Resp (!) 24   Ht '5\' 10"'$  (1.778 m)   Wt 77.4 kg   SpO2 97%   BMI 24.48 kg/m    Physical Exam Constitutional: Elderly male lying in the bed and appears comfortable    Comments:   Cardiovascular:     Rate and Rhythm: Normal rate and regular rhythm.     Heart sounds: S1-S2  Pulmonary:     Effort: Pulmonary effort is normal on nasal cannula at [facility]    Comments: Normal breath sounds  Abdominal:     Palpations: Abdomen is soft.     Tenderness: Distended and  nontender, laparoscopic port sites look okay  Musculoskeletal:        General: No swelling or tenderness in peripheral joints  Skin:    Comments: No rashes  Neurological:     General: awake, alert and oriented, grossly non focal and follows commands   Psychiatric:        Mood and Affect: Mood normal.    Pertinent Microbiology Results for orders placed or performed during the hospital encounter of 01/26/23  Blood Culture (routine x 2)     Status: None (Preliminary result)   Collection Time: 01/26/23  3:48 AM   Specimen: BLOOD  Result Value Ref Range Status   Specimen Description   Final    BLOOD Performed at [facility], [address], [place], [place] [address]    Special Requests   Final    NONE Performed at [facility], [address], [place], [place] [address]    Culture  Setup Time   Final    GRAM NEGATIVE RODS IN BOTH AEROBIC AND ANAEROBIC BOTTLES CRITICAL RESULT CALLED TO, READ BACK BY AND VERIFIED WITH: [place] 01/27/23 @ 0126 BY AB Performed at [facility], [place] [address], [place], [place] [address]    Culture GRAM NEGATIVE RODS  Final   Report Status PENDING   Incomplete  Urine Culture     Status: Abnormal (Preliminary result)   Collection Time: 01/26/23  3:48 AM   Specimen: Urine, Clean Catch  Result Value Ref Range Status   Specimen Description   Final    URINE, CLEAN CATCH Performed at [facility], [address] [place], [place] [address]    Special Requests   Final    NONE Performed at [facility], [place], [place] [address]    Culture (A)  Final    50,000 COLONIES/mL ESCHERICHIA COLI SUSCEPTIBILITIES TO FOLLOW Performed at [facility] [address], [place], [place] [address]    Report Status PENDING  Incomplete  Resp panel by RT-PCR (RSV, Flu A&B, Covid) Anterior Nasal Swab     Status: None   Collection Time: 01/26/23  3:48 AM   Specimen: Anterior Nasal Swab  Result Value Ref Range Status   SARS Coronavirus 2 by RT PCR NEGATIVE NEGATIVE Final    Comment: (NOTE) SARS-CoV-2 target nucleic acids are NOT DETECTED.  The SARS-CoV-2 RNA is generally detectable in upper respiratory specimens during the acute phase of infection. The lowest concentration of SARS-CoV-2 viral copies this assay can detect is 138 copies/mL. A negative result does not preclude SARS-Cov-2 infection and should not be used as the sole basis for treatment or other patient management decisions. A negative result may occur with  improper specimen collection/handling, submission of specimen other than nasopharyngeal swab, presence of viral mutation(s) within the areas targeted by this assay, and inadequate number of viral copies(<138 copies/mL). A negative result must be combined with clinical observations, patient history, and epidemiological information. The expected result is Negative.  Fact Sheet for Patients:  EntrepreneurPulse.com.au  Fact Sheet for Healthcare Providers:  IncredibleEmployment.be  This test is no t yet approved or cleared by the [place] FDA and  has been authorized for detection and/or diagnosis of SARS-CoV-2 by FDA under an Emergency Use Authorization (EUA). This EUA will remain  in effect (meaning this test can be used) for the duration of the COVID-19 declaration under Section 564(b)(1) of the Act, 21 U.S.C.section 360bbb-3(b)(1), unless the authorization is  terminated  or revoked sooner.       Influenza A by PCR NEGATIVE NEGATIVE Final   Influenza B by PCR NEGATIVE NEGATIVE Final    Comment: (NOTE) The Xpert Xpress SARS-CoV-2/FLU/RSV plus assay is intended as an aid in the diagnosis of influenza from Nasopharyngeal swab specimens and should not be used as a sole basis for treatment. Nasal washings and aspirates are unacceptable for Xpert Xpress SARS-CoV-2/FLU/RSV testing.  Fact Sheet for Patients: EntrepreneurPulse.com.au  Fact Sheet for Healthcare Providers: IncredibleEmployment.be  This test is not yet approved or cleared by the [place] FDA and has been authorized for detection and/or diagnosis of SARS-CoV-2 by FDA under an Emergency Use Authorization (EUA). This EUA will remain in effect (meaning this test can be used) for the duration of the COVID-19 declaration under Section 564(b)(1) of the Act, 21 U.S.C. section 360bbb-3(b)(1), unless the authorization is terminated or revoked.     Resp Syncytial Virus by PCR NEGATIVE NEGATIVE Final    Comment: (NOTE) Fact Sheet for Patients: EntrepreneurPulse.com.au  Fact Sheet for Healthcare Providers: IncredibleEmployment.be  This test is not yet approved or cleared by the [place] FDA and has been authorized for detection and/or diagnosis of SARS-CoV-2 by FDA under an Emergency Use Authorization (EUA). This EUA will remain in effect (meaning this test can be used) for the duration of the COVID-19 declaration under Section 564(b)(1) of the Act, 21 U.S.C. section  360bbb-3(b)(1), unless the authorization is terminated or revoked.  Performed at [facility], [address], [place], [place] [address]   Blood Culture ID Panel (Reflexed)     Status: Abnormal   Collection Time: 01/26/23  3:48 AM  Result Value Ref Range Status   Enterococcus faecalis NOT DETECTED NOT DETECTED Final   Enterococcus Faecium NOT DETECTED NOT DETECTED Final   Listeria monocytogenes NOT DETECTED NOT DETECTED Final   Staphylococcus species NOT DETECTED NOT DETECTED Final   Staphylococcus aureus (BCID) NOT DETECTED NOT DETECTED Final   Staphylococcus epidermidis NOT DETECTED NOT DETECTED Final   Staphylococcus lugdunensis NOT DETECTED NOT DETECTED Final   Streptococcus species NOT DETECTED NOT DETECTED Final   Streptococcus agalactiae NOT DETECTED NOT DETECTED Final   Streptococcus pneumoniae NOT DETECTED NOT DETECTED Final   Streptococcus pyogenes NOT DETECTED NOT DETECTED Final   A.calcoaceticus-baumannii NOT DETECTED NOT DETECTED Final   Bacteroides fragilis NOT DETECTED NOT DETECTED Final   Enterobacterales DETECTED (A) NOT DETECTED Final    Comment: Enterobacterales represent a large order of gram negative bacteria, not a single organism. CRITICAL RESULT CALLED TO, READ BACK BY AND VERIFIED WITH: PHARMD M. LILLISTON 01/27/23 @ 0126 BY AB    Enterobacter cloacae complex NOT DETECTED NOT DETECTED Final   Escherichia coli DETECTED (A) NOT DETECTED Final    Comment: CRITICAL RESULT CALLED TO, READ BACK BY AND VERIFIED WITH: PHARMD M. LILLISTON 01/27/23 @ 0126 BY AB    Klebsiella aerogenes NOT DETECTED NOT DETECTED Final   Klebsiella oxytoca NOT DETECTED NOT DETECTED Final   Klebsiella pneumoniae NOT DETECTED NOT DETECTED Final   Proteus species NOT DETECTED NOT DETECTED Final   Salmonella species NOT DETECTED NOT DETECTED Final   Serratia marcescens NOT DETECTED NOT DETECTED Final   Haemophilus influenzae NOT DETECTED NOT DETECTED Final   Neisseria  meningitidis NOT DETECTED NOT DETECTED Final   Pseudomonas aeruginosa NOT DETECTED NOT DETECTED Final   Stenotrophomonas maltophilia NOT DETECTED NOT DETECTED Final   Candida albicans NOT DETECTED NOT DETECTED Final   Candida auris  NOT DETECTED NOT DETECTED Final   Candida glabrata NOT DETECTED NOT DETECTED Final   Candida krusei NOT DETECTED NOT DETECTED Final   Candida parapsilosis NOT DETECTED NOT DETECTED Final   Candida tropicalis NOT DETECTED NOT DETECTED Final   Cryptococcus neoformans/gattii NOT DETECTED NOT DETECTED Final   CTX-M ESBL DETECTED (A) NOT DETECTED Final    Comment: CRITICAL RESULT CALLED TO, READ BACK BY AND VERIFIED WITH: PHARMD M. LILLISTON 01/27/23 @ 0126 BY AB (NOTE) Extended spectrum beta-lactamase detected. Recommend a carbapenem as initial therapy.      Carbapenem resistance IMP NOT DETECTED NOT DETECTED Final   Carbapenem resistance KPC NOT DETECTED NOT DETECTED Final   Carbapenem resistance NDM NOT DETECTED NOT DETECTED Final   Carbapenem resist OXA 48 LIKE NOT DETECTED NOT DETECTED Final   Carbapenem resistance VIM NOT DETECTED NOT DETECTED Final    Comment: Performed at [facility], [place] [address], [place], [place] [address]  Blood Culture (routine x 2)     Status: None (Preliminary result)   Collection Time: 01/26/23  3:59 AM   Specimen: BLOOD  Result Value Ref Range Status   Specimen Description   Final    BLOOD Performed at [facility], [address] [place], [place] [address]    Special Requests   Final    NONE Performed at [facility], [address], [place], [place] [address]    Culture  Setup Time   Final    GRAM NEGATIVE RODS AEROBIC BOTTLE ONLY CRITICAL VALUE NOTED.  VALUE IS CONSISTENT WITH PREVIOUSLY REPORTED AND CALLED VALUE. Performed at [facility], [place] [address] [place], [place] [address]    Culture GRAM NEGATIVE RODS  Final   Report Status PENDING  Incomplete  MRSA  Next Gen by PCR, Nasal     Status: None   Collection Time: 01/26/23 10:00 AM   Specimen: Nasal Mucosa; Nasal Swab  Result Value Ref Range Status   MRSA by PCR Next Gen NOT DETECTED NOT DETECTED Final    Comment: (NOTE) The GeneXpert MRSA Assay (FDA approved for NASAL specimens only), is one component of a comprehensive MRSA colonization surveillance program. It is not intended to diagnose MRSA infection nor to guide or monitor treatment for MRSA infections. Test performance is not FDA approved in patients less than 20 years old. Performed at [facility], [place] [address] [place], [place] [address]     Pertinent Lab seen by me:    Latest Ref Rng & Units 01/28/2023    3:11 AM 01/27/2023   12:44 AM 01/26/2023   10:51 AM  CBC  WBC 4.0 - 10.5 K/uL 5.1  7.2  12.9   Hemoglobin 13.0 - 17.0 g/dL 8.9  8.3  9.4   Hematocrit 39.0 - 52.0 % 26.0  25.1  27.5   Platelets 150 - 400 K/uL 136  137  159       Latest Ref Rng & Units 01/28/2023    3:11 AM 01/27/2023   12:44 AM 01/26/2023   10:51 AM  CMP  Glucose 70 - 99 mg/dL 107  108  119   BUN 8 - 23 mg/dL '12  14  16   '$ Creatinine 0.61 - 1.24 mg/dL 0.80  0.79  0.75   Sodium 135 - 145 mmol/L 128  130  131   Potassium 3.5 - 5.1 mmol/L 3.5  4.0  3.8   Chloride 98 - 111 mmol/L 97  100  97  CO2 22 - 32 mmol/L '23  23  22   '$ Calcium 8.9 - 10.3 mg/dL 7.8  7.7  8.1   Total Protein 6.5 - 8.1 g/dL 5.5  5.3  6.0   Total Bilirubin 0.3 - 1.2 mg/dL 1.0  0.8  1.1   Alkaline Phos 38 - 126 U/L 161  164  175   AST 15 - 41 U/L 49  69  83   ALT 0 - 44 U/L 68  88  109      Pertinent Imagings/Other Imagings Plain films and CT images have been personally visualized and interpreted; radiology reports have been reviewed. Decision making incorporated into the Impression / Recommendations.  ECHOCARDIOGRAM COMPLETE  Result Date: 01/27/2023    ECHOCARDIOGRAM REPORT   Patient Name:   Dayten Felland. Date of Exam: 01/27/2023 Medical Rec #:   [id number]               Height:       70.0 in Accession #:    [id number]              Weight:       174.8 lb Date of Birth:  1951-10-12               BSA:          1.971 m Patient Age:    66 years                BP:           125/59 mmHg Patient Gender: M                       HR:           87 bpm. Exam Location:  Inpatient Procedure: 2D Echo, 3D Echo, Color Doppler and Cardiac Doppler Indications:     dyspnea  History:         Patient has no prior history of Echocardiogram examinations.  Sonographer:     Harvie Junior Referring Phys:  [id number] [place] Diagnosing Phys: Dixie Dials MD IMPRESSIONS  1. Left ventricular ejection fraction, by estimation, is 55 to 60%. The left ventricle has normal function. The left ventricle has no regional wall motion abnormalities. Left ventricular diastolic parameters are consistent with Grade I diastolic dysfunction (impaired relaxation).  2. Right ventricular systolic function is normal. The right ventricular size is normal. There is moderately elevated pulmonary artery systolic pressure.  3. Left atrial size was mildly dilated.  4. Right atrial size was mildly dilated.  5. The mitral valve is normal in structure. Mild mitral valve regurgitation.  6. The aortic valve is normal in structure. Aortic valve regurgitation is not visualized.  7. There is mild (Grade II) atheroma plaque involving the ascending aorta.  8. The inferior vena cava is normal in size with greater than 50% respiratory variability, suggesting right atrial pressure of 3 mmHg. FINDINGS  Left Ventricle: Left ventricular ejection fraction, by estimation, is 55 to 60%. The left ventricle has normal function. The left ventricle has no regional wall motion abnormalities. The left ventricular internal cavity size was normal in size. There is  no left ventricular hypertrophy. Left ventricular diastolic parameters are consistent with Grade I diastolic dysfunction (impaired relaxation). Right Ventricle: The right  ventricular size is normal. No increase in right ventricular wall thickness. Right ventricular systolic function is normal. There is moderately elevated pulmonary artery systolic pressure. The tricuspid regurgitant velocity is 3.49  m/s, and with an assumed right atrial pressure of 3 mmHg, the estimated right ventricular systolic pressure is [id number] mmHg. Left Atrium: Left atrial size was mildly dilated. Right Atrium: Right atrial size was mildly dilated. Pericardium: There is no evidence of pericardial effusion. Mitral Valve: The mitral valve is normal in structure. Mild mitral valve regurgitation. Tricuspid Valve: The tricuspid valve is normal in structure. Tricuspid valve regurgitation is mild. Aortic Valve: The aortic valve is normal in structure. Aortic valve regurgitation is not visualized. Aortic valve mean gradient measures 3.0 mmHg. Aortic valve peak gradient measures 5.6 mmHg. Aortic valve area, by VTI measures 3.05 cm. Pulmonic Valve: The pulmonic valve was normal in structure. Pulmonic valve regurgitation is not visualized. Aorta: The aortic root is normal in size and structure. There is mild (Grade II) atheroma plaque involving the ascending aorta. Venous: The inferior vena cava is normal in size with greater than 50% respiratory variability, suggesting right atrial pressure of 3 mmHg. IAS/Shunts: The atrial septum is grossly normal.  LEFT VENTRICLE PLAX 2D LVIDd:         5.40 cm      Diastology LVIDs:         3.50 cm      LV e' medial:    13.20 cm/s LV PW:         1.00 cm      LV E/e' medial:  5.2 LV IVS:        1.00 cm      LV e' lateral:   16.50 cm/s LVOT diam:     2.20 cm      LV E/e' lateral: 4.2 LV SV:         59 LV SV Index:   30 LVOT Area:     3.80 cm                              3D Volume EF: LV Volumes (MOD)            3D EF:        63 % LV vol d, MOD A2C: 123.0 ml LV EDV:       154 ml LV vol d, MOD A4C: 107.0 ml LV ESV:       57 ml LV vol s, MOD A2C: 44.0 ml  LV SV:        96 ml LV vol s, MOD  A4C: 42.7 ml LV SV MOD A2C:     79.0 ml LV SV MOD A4C:     107.0 ml LV SV MOD BP:      73.3 ml RIGHT VENTRICLE RV Basal diam:  4.10 cm RV Mid diam:    3.30 cm RV S prime:     14.90 cm/s TAPSE (M-mode): 2.2 cm LEFT ATRIUM           Index        RIGHT ATRIUM           Index LA diam:      3.30 cm 1.67 cm/m   RA Area:     15.00 cm LA Vol (A4C): 57.4 ml 29.12 ml/m  RA Volume:   40.20 ml  20.39 ml/m  AORTIC VALVE                    PULMONIC VALVE AV Area (Vmax):    3.10 cm     PV Vmax:  1.03 m/s AV Area (Vmean):   3.10 cm     PV Peak grad:     4.2 mmHg AV Area (VTI):     3.05 cm     PR End Diast Vel: 5.86 msec AV Vmax:           118.00 cm/s AV Vmean:          78.200 cm/s AV VTI:            0.192 m AV Peak Grad:      5.6 mmHg AV Mean Grad:      3.0 mmHg LVOT Vmax:         96.30 cm/s LVOT Vmean:        63.800 cm/s LVOT VTI:          0.154 m LVOT/AV VTI ratio: 0.80  AORTA Ao Root diam: 3.70 cm Ao Asc diam:  3.10 cm MITRAL VALVE               TRICUSPID VALVE MV Area (PHT): 4.06 cm    TR Peak grad:   48.7 mmHg MV Decel Time: 187 msec    TR Vmax:        349.00 cm/s MR Peak grad: 49.6 mmHg MR Vmax:      352.00 cm/s  SHUNTS MV E velocity: 68.60 cm/s  Systemic VTI:  0.15 m MV A velocity: 77.50 cm/s  Systemic Diam: 2.20 cm MV E/A ratio:  0.89 Dixie Dials MD Electronically signed by Dixie Dials MD Signature Date/Time: 01/27/2023/5:18:56 PM    Final    CT Angio Chest Pulmonary Embolism (PE) W or WO Contrast  Result Date: 01/26/2023 CLINICAL DATA:  High probability for PE. Status post prostatectomy. Shortness of breath and fever. EXAM: CT ANGIOGRAPHY CHEST WITH CONTRAST TECHNIQUE: Multidetector CT imaging of the chest was performed using the standard protocol during bolus administration of intravenous contrast. Multiplanar CT image reconstructions and MIPs were obtained to evaluate the vascular anatomy. RADIATION DOSE REDUCTION: This exam was performed according to the departmental dose-optimization program which  includes automated exposure control, adjustment of the mA and/or kV according to patient size and/or use of iterative reconstruction technique. CONTRAST:  14m OMNIPAQUE IOHEXOL 350 MG/ML SOLN COMPARISON:  CT abdomen and pelvis same day FINDINGS: Cardiovascular: Satisfactory opacification of the pulmonary arteries to the segmental level. No evidence of pulmonary embolism. Normal heart size. No pericardial effusion. There are atherosclerotic calcifications of the aorta. Mediastinum/Nodes: There are enlarged paratracheal lymph nodes measuring up to 10 mm short axis. Enlarged subcarinal lymph node measures 12 mm. Enlarged right hilar lymph node measures 15 mm. Enlarged left hilar lymph node measures 12 mm. Esophagus is within normal limits. There is a small hiatal hernia. Visualized thyroid gland is within normal limits. Lungs/Pleura: There are trace bilateral pleural effusions. There some scattered patchy ground-glass opacities in both mid and lower lungs with some smooth interlobular septal thickening in the lung bases. Trachea and central airways are patent. No pneumothorax. Upper Abdomen: No acute abnormality. Musculoskeletal: There is mild compression deformity of the superior endplate of T7 which is age indeterminate. Review of the MIP images confirms the above findings. IMPRESSION: 1. No evidence for pulmonary embolism. 2. Trace bilateral pleural effusions. 3. Patchy ground-glass opacities in the mid and lower lungs with smooth interlobular septal thickening in the lung bases worrisome for pulmonary edema. 4. Mediastinal and bilateral hilar lymphadenopathy of uncertain etiology. 5. Age-indeterminate T7 compression deformity. Aortic Atherosclerosis (ICD10-I70.0). Electronically Signed   By: ARonney Asters  M.D.   On: 01/26/2023 21:33   CT Abdomen Pelvis W Contrast  Result Date: 01/26/2023 CLINICAL DATA:  Pain and fever after prostatectomy. EXAM: CT ABDOMEN AND PELVIS WITH CONTRAST TECHNIQUE: Multidetector CT  imaging of the abdomen and pelvis was performed using the standard protocol following bolus administration of intravenous contrast. RADIATION DOSE REDUCTION: This exam was performed according to the departmental dose-optimization program which includes automated exposure control, adjustment of the mA and/or kV according to patient size and/or use of iterative reconstruction technique. CONTRAST:  137m OMNIPAQUE IOHEXOL 300 MG/ML  SOLN COMPARISON:  None Available. FINDINGS: Lower chest: No acute abnormality. Hepatobiliary: No focal liver abnormality is seen. No gallstones, gallbladder wall thickening, or biliary dilatation. Pancreas: Unremarkable. No pancreatic ductal dilatation or surrounding inflammatory changes. Spleen: Normal in size without focal abnormality. Adrenals/Urinary Tract: Normal adrenal glands. Bosniak class 1 and 2 kidney cysts identified. The largest arises off the upper pole of the left kidney measuring 1.4 cm. No follow-up imaging recommended. Bilateral striated nephrograms identified concerning for pyelonephritis. No nephrolithiasis or hydronephrosis. Urinary bladder is unremarkable. Stomach/Bowel: Small hiatal hernia. The appendix is visualized and is within normal limits. Sigmoid diverticulosis without signs of acute diverticulitis. No bowel wall thickening, inflammation or distension. Vascular/Lymphatic: Aortic atherosclerosis. No abdominopelvic adenopathy. Reproductive: Status post prostatectomy. No abnormal fluid collections identified within the prostate bed. Other: Small volume of free fluid is identified within the pelvis. No focal fluid collections. Musculoskeletal: No acute or significant osseous findings. IMPRESSION: 1. Bilateral striated nephrograms concerning for acute pyelonephritis. 2. Status post prostatectomy. No abnormal fluid collections identified within the prostate bed. 3. Small volume of free fluid is identified within the pelvis. Nonspecific in the early postoperative time  frame. 4. Small hiatal hernia. 5.  Aortic Atherosclerosis (ICD10-I70.0). Electronically Signed   By: TKerby MoorsM.D.   On: 01/26/2023 05:14   DG Chest Port 1 View  Result Date: 01/26/2023 CLINICAL DATA:  72year old male with possible sepsis. Shortness of breath. EXAM: PORTABLE CHEST 1 VIEW COMPARISON:  None Available. FINDINGS: Portable AP view at 0403 hours. Normal lung volumes and mediastinal contours. Visualized tracheal air column is within normal limits. Allowing for portable technique the lungs are clear. No pneumothorax or pleural effusion. Paucity of bowel gas in the visible abdomen. No acute osseous abnormality identified. IMPRESSION: Negative portable chest. Electronically Signed   By: HGenevie AnnM.D.   On: 01/26/2023 04:15     I spent 100 minutes for this patient encounter including review of prior medical records/discussing diagnostics and treatment plan with the patient/family/coordinate care with primary/other specialits with greater than 50% of time in face to face encounter.   Electronically signed by:   SRosiland Oz MD Infectious Disease Physician CSt [facility] Center[facility] Pager: [phone]

## 2023-01-28 NOTE — Progress Notes (Signed)
ANTICOAGULATION CONSULT NOTE - Follow Up Consult  Pharmacy Consult for Heparin Indication: chest pain/ACS  No Known Allergies  Patient Measurements: Height: '5\' 10"'$  (177.8 cm) Weight: 77.4 kg (170 lb 10.2 oz) IBW/kg (Calculated) : 73 Heparin Dosing Weight:  79.3 kg  Vital Signs: Temp: 98.2 F (36.8 C) (03/13 0754) Temp Source: Oral (03/13 0754) BP: 114/72 (03/13 0700) Pulse Rate: 69 (03/13 0730)  Labs: Recent Labs    01/26/23 1051 01/26/23 1051 01/26/23 1202 01/26/23 1432 01/26/23 1622 01/26/23 2043 01/27/23 0044 01/27/23 1040 01/27/23 1846 01/28/23 0311  HGB 9.4*  --   --   --   --   --  8.3*  --   --  8.9*  HCT 27.5*  --   --   --   --   --  25.1*  --   --  26.0*  PLT 159  --   --   --   --   --  137*  --   --  136*  APTT 39*  --   --   --   --   --   --   --   --   --   LABPROT 16.9*  --   --   --   --   --   --   --   --   --   INR 1.4*  --   --   --   --   --   --   --   --   --   HEPARINUNFRC  --    < >  --   --   --   --  <0.10* 0.24* 0.33 0.32  CREATININE 0.75  --   --   --   --   --  0.79  --   --  0.80  TROPONINIHS  --   --    < > 1,522* 1,438* 1,591*  --   --   --   --    < > = values in this interval not displayed.     Estimated Creatinine Clearance: 88.7 mL/min (by C-G formula based on SCr of 0.8 mg/dL).   Assessment: AC/Heme: heparin per Rx for ACS Hgb 9.4>8.3 overnight. Plts 137 low end.  Today, 01/28/23 03:11 heparin level therapeutic at 0.32 with heparin infusing at 1350 units/hr  Goal of Therapy:  Heparin level 0.3-0.7 units/ml Monitor platelets by anticoagulation protocol: Yes   Plan:  Con't IV heparin at 1350 units/hr Monitor daily heparin level, CBC, signs/symptoms of bleeding   Thank you for allowing pharmacy to be a part of this patient's care.  Royetta Asal, PharmD, BCPS Clinical Pharmacist  Please utilize Amion for appropriate phone number to reach the unit pharmacist ([place]) 01/28/2023 8:08 AM

## 2023-01-28 NOTE — Progress Notes (Signed)
PROGRESS NOTE    Trevor Wilson.  [id number] DOB: Jun 28, 1951 DOA: 01/26/2023 PCP: Lujean Amel, MD    Chief Complaint  Patient presents with   Shortness of Breath    Brief Narrative:  Patient 72 year old gentleman history of arthritis, GERD, grade 1 prostate cancer diagnosed 11/05/2020 status post truce biopsy with progression to grade 1-3 10/2022 as well as LUTS despite use of twice daily Flomax.  Patient noted in the interim to have a prostatectomy and node dissection done in January 07 2023, and did well postoperatively.  Patient presented to [facility] 01/26/2023 with shortness of breath in the setting of high fevers and rigors.  Patient denies any cough chest pain upper respiratory symptoms.  Patient denies any dysuria diarrhea abdominal pain.  Patient noted on presentation to have a temp of 101.2, BP 115/68 with a heart rate of 105 respiratory rate 22.  CT abdomen and pelvis done showed bilateral striated nephrograms concerning for acute pyelonephritis, status post prostatectomy.  Patient admitted, pancultured placed empirically on IV antibiotics.  Blood cultures came back positive for ESBL E. coli bacteremia, urine cultures positive for E. coli.  Patient initially was on IV cefepime and transition to IV meropenem.  Patient also noted to be in volume overload, and concern for elevated troponin/non-STEMI seen by cardiology, diuresed.  ID also consulted.   Assessment & Plan:   Principal Problem:   Sepsis secondary to UTI [facility]) Active Problems:   Pyelonephritis   NSTEMI (non-ST elevated myocardial infarction) ([place])   Hypervolemia   Infection due to ESBL-producing Escherichia coli   UTI due to extended-spectrum beta lactamase (ESBL) producing Escherichia coli   Gastroesophageal reflux disease   Hyponatremia   Normocytic anemia  #1 severe sepsis secondary to pyelonephritis and ESBL E. coli bacteremia -Patient admitted with rigors, high fevers, shortness of  breath in the setting of recent prostatectomy 01/07/2023. -CT abdomen and pelvis done concerning for bilateral acute pyelonephritis. -Urinalysis done concerning for UTI. -Blood cultures now positive for ESBL E. coli bacteremia, urine cultures with ESBL E. coli. -Patient with a leukocytosis on admission which has trended down. -Fever curve trending down. -Patient improving clinically. -Was initially on IV vancomycin and IV cefepime and IV antibiotics changed to IV meropenem. -Due to ESBL bacteremia and ESBL E. coli in the urine, ID consulted for further evaluation, recommendations and management.  2.  Volume overload -Felt likely secondary to sepsis in the setting of aggressive fluid resuscitation. -EKG done with no ischemic changes noted. -CT angiogram chest done negative for PE. -2D echo done with EF of 55 to [id number], grade 1 diastolic dysfunction.  Normal right ventricular systolic function.  Moderately elevated pulmonary artery systolic pressure.  Mildly dilated left atrial size.  Mildly dilated right atrial size.  Grade 2 atheroma plaque involving the ascending aorta. -Patient received IV Lasix prior cardiology recommendations and patient with 700 cc of urine output recorded over the past 24 hours however doubt accuracy of urine output. -Patient improved clinically with sats of 97% on 1 L nasal cannula. -No further recommendations per cardiology, cardiology is following.  3.  Non-STEMI/elevated troponins -Felt likely secondary to demand ischemia secondary to problem #1. -CT angiogram chest negative for PE. --2D echo done with EF of 55 to [id number], grade 1 diastolic dysfunction.  Normal right ventricular systolic function.  Moderately elevated pulmonary artery systolic pressure.  Mildly dilated left atrial size.  Mildly dilated right atrial size.  Grade 2 atheroma plaque involving the ascending aorta. -Cardiology recommending  to complete 48 hours of heparin and to continue aspirin and  statin. -Patient currently denies any chest pain. -Cardiology recommending outpatient coronary CTA with Dr. Johney Frame. -Outpatient follow-up. -Appreciate cardiology input and recommendations.  4.  Status post prostatectomy 01/07/2023 -CT abdomen and pelvis done with concern for bilateral acute pyelonephritis status post prostatectomy.  No abnormal fluid collections identified within the prostate bed.  Small volume of free fluid identified within the pelvis. -Patient being treated with IV antibiotics for pyelonephritis. -Outpatient follow-up with urology.  5.  Transaminitis/elevated alk phosphatase -Likely in the setting of volume overload and sepsis. -LFTs trending down. -If no significant improvement in the following days we will check a right upper quadrant ultrasound as well as an acute hepatitis panel.  6.  Hyponatremia -In the setting of diuresis. -Follow for now. -Repeat labs in the AM.  7.-Hyperlipidemia -Statin initially held and recommended to be resumed per cardiology.  8.  Normocytic anemia -Anemia panel done with iron level of 9, TIBC of 192, ferritin of 366, folate of 22.6, vitamin B12 of 1018. -H&H stable. -Transfusion threshold hemoglobin < 7.  9.  Overweight -BMI approximately 25.08 kg/g/m. -Lifestyle modification -Outpatient follow-up with PCP.  10.  GERD -PPI.   DVT prophylaxis: Heparin drip Code Status: Full Family Communication: Updated patient and wife at bedside. Disposition: Likely home when clinically improved.  Status is: Inpatient Remains inpatient appropriate because: Severity of illness   Consultants:  ID: Dr.Manandhar 01/28/2023 Surgery: Dr. Johney Frame 01/27/2023  Procedures:  CT angiogram chest 01/26/2023 Chest x-ray 01/26/2023 2D echo 01/27/2023   Antimicrobials:  IV cefepime 01/26/2023>>>> 01/27/2023 IV Merrem 01/27/2023>>>> IV Flagyl x 1 01/26/2023 IV vancomycin 311 2024 x 1 dose   Subjective: Sitting up in bed.  Overall states he  feels very well today.  Denies any chest pain.  No shortness of breath.  No abdominal pain.  Denies any further rigors or chills.  Tolerating oral intake.  Wife at bedside.  States just saw infectious disease doctor this morning.  Objective: Vitals:   01/28/23 0900 01/28/23 1000 01/28/23 1130 01/28/23 1204  BP: 128/68 111/68  114/68  Pulse: 91 72  61  Resp: 20 (!) 21  (!) 21  Temp:   98.2 F (36.8 C)   TempSrc:   Oral   SpO2: 98% 99%  97%  Weight:      Height:        Intake/Output Summary (Last 24 hours) at 01/28/2023 1645 Last data filed at 01/28/2023 1531 Gross per 24 hour  Intake 753.9 ml  Output 325 ml  Net 428.9 ml   Filed Weights   01/26/23 1000 01/27/23 0326 01/28/23 0500  Weight: 77 kg 79.3 kg 77.4 kg    Examination:  General exam: Appears calm and comfortable  Respiratory system: Clear to auscultation.  No wheezes, no crackles, no rhonchi.  Fair air movement.  Speaking in full sentences.  Respiratory effort normal. Cardiovascular system: S1 & S2 heard, RRR. No JVD, murmurs, rubs, gallops or clicks. No pedal edema. Gastrointestinal system: Abdomen is nondistended, soft and nontender. No organomegaly or masses felt. Normal bowel sounds heard. Central nervous system: Alert and oriented. No focal neurological deficits. Extremities: Symmetric 5 x 5 power. Skin: No rashes, lesions or ulcers Psychiatry: Judgement and insight appear normal. Mood & affect appropriate.     Data Reviewed: I have personally reviewed following labs and imaging studies  CBC: Recent Labs  Lab 01/26/23 0348 01/26/23 1051 01/27/23 0044 01/28/23 0311  WBC 4.3 12.9* 7.2  5.1  NEUTROABS 3.8 12.2*  --  3.7  HGB 10.3* 9.4* 8.3* 8.9*  HCT 29.2* 27.5* 25.1* 26.0*  MCV 93.6 96.5 98.8 95.9  PLT 189 159 137* 136*    Basic Metabolic Panel: Recent Labs  Lab 01/26/23 0348 01/26/23 1051 01/27/23 0044 01/28/23 0311  NA 130* 131* 130* 128*  K 4.5 3.8 4.0 3.5  CL 98 97* 100 97*  CO2 17* '22 23  23  '$ GLUCOSE 136* 119* 108* 107*  BUN '19 16 14 12  '$ CREATININE 0.91 0.75 0.79 0.80  CALCIUM 8.7* 8.1* 7.7* 7.8*  MG  --   --   --  1.9  PHOS  --   --   --  3.2    GFR: Estimated Creatinine Clearance: 88.7 mL/min (by C-G formula based on SCr of 0.8 mg/dL).  Liver Function Tests: Recent Labs  Lab 01/26/23 0348 01/26/23 1051 01/27/23 0044 01/28/23 0311  AST 95* 83* 69* 49*  ALT 131* 109* 88* 68*  ALKPHOS 172* 175* 164* 161*  BILITOT 0.8 1.1 0.8 1.0  PROT 6.7 6.0* 5.3* 5.5*  ALBUMIN 3.5 2.9* 2.6* 2.7*    CBG: Recent Labs  Lab 01/27/23 0824 01/28/23 0747  GLUCAP 91 98     Recent Results (from the past 240 hour(s))  Blood Culture (routine x 2)     Status: Abnormal (Preliminary result)   Collection Time: 01/26/23  3:48 AM   Specimen: BLOOD  Result Value Ref Range Status   Specimen Description   Final    BLOOD Performed at [facility], [address] [place], [place] [address]    Special Requests   Final    NONE Performed at [facility], [address] [place], [place] [address]    Culture  Setup Time   Final    GRAM NEGATIVE RODS IN BOTH AEROBIC AND ANAEROBIC BOTTLES CRITICAL RESULT CALLED TO, READ BACK BY AND VERIFIED WITH: [place] 01/27/23 @ 0126 BY AB Performed at [facility], [place] [address] [place], [place] [address]    Culture ESCHERICHIA COLI (A)  Final   Report Status PENDING  Incomplete  Urine Culture     Status: Abnormal   Collection Time: 01/26/23  3:48 AM   Specimen: Urine, Clean Catch  Result Value Ref Range Status   Specimen Description   Final    URINE, CLEAN CATCH Performed at [facility], [address] [place], [place] [address]    Special Requests   Final    NONE Performed at [facility], [address] [place], [place] [address]    Culture (A)  Final    50,000 COLONIES/mL ESCHERICHIA COLI Confirmed Extended Spectrum Beta-Lactamase  Producer (ESBL).  In bloodstream infections from ESBL organisms, carbapenems are preferred over piperacillin/tazobactam. They are shown to have a lower risk of mortality.    Report Status 01/28/2023 FINAL  Final   Organism ID, Bacteria ESCHERICHIA COLI (A)  Final      Susceptibility   Escherichia coli - MIC*    AMPICILLIN >=32 RESISTANT Resistant     CEFAZOLIN >=64 RESISTANT Resistant     CEFEPIME 16 RESISTANT Resistant     CEFTRIAXONE >=64 RESISTANT Resistant     CIPROFLOXACIN <=0.25 SENSITIVE Sensitive     GENTAMICIN >=16 RESISTANT Resistant     IMIPENEM <=0.25 SENSITIVE Sensitive     NITROFURANTOIN <=16 SENSITIVE Sensitive     TRIMETH/SULFA >=320 RESISTANT Resistant     AMPICILLIN/SULBACTAM >=32 RESISTANT Resistant  PIP/TAZO <=4 SENSITIVE Sensitive     * 50,000 COLONIES/mL ESCHERICHIA COLI  Resp panel by RT-PCR (RSV, Flu A&B, Covid) Anterior Nasal Swab     Status: None   Collection Time: 01/26/23  3:48 AM   Specimen: Anterior Nasal Swab  Result Value Ref Range Status   SARS Coronavirus 2 by RT PCR NEGATIVE NEGATIVE Final    Comment: (NOTE) SARS-CoV-2 target nucleic acids are NOT DETECTED.  The SARS-CoV-2 RNA is generally detectable in upper respiratory specimens during the acute phase of infection. The lowest concentration of SARS-CoV-2 viral copies this assay can detect is 138 copies/mL. A negative result does not preclude SARS-Cov-2 infection and should not be used as the sole basis for treatment or other patient management decisions. A negative result may occur with  improper specimen collection/handling, submission of specimen other than nasopharyngeal swab, presence of viral mutation(s) within the areas targeted by this assay, and inadequate number of viral copies(<138 copies/mL). A negative result must be combined with clinical observations, patient history, and epidemiological information. The expected result is Negative.  Fact Sheet for Patients:   EntrepreneurPulse.com.au  Fact Sheet for Healthcare Providers:  IncredibleEmployment.be  This test is no t yet approved or cleared by the [place] FDA and  has been authorized for detection and/or diagnosis of SARS-CoV-2 by FDA under an Emergency Use Authorization (EUA). This EUA will remain  in effect (meaning this test can be used) for the duration of the COVID-19 declaration under Section 564(b)(1) of the Act, 21 U.S.C.section 360bbb-3(b)(1), unless the authorization is terminated  or revoked sooner.       Influenza A by PCR NEGATIVE NEGATIVE Final   Influenza B by PCR NEGATIVE NEGATIVE Final    Comment: (NOTE) The Xpert Xpress SARS-CoV-2/FLU/RSV plus assay is intended as an aid in the diagnosis of influenza from Nasopharyngeal swab specimens and should not be used as a sole basis for treatment. Nasal washings and aspirates are unacceptable for Xpert Xpress SARS-CoV-2/FLU/RSV testing.  Fact Sheet for Patients: EntrepreneurPulse.com.au  Fact Sheet for Healthcare Providers: IncredibleEmployment.be  This test is not yet approved or cleared by the [place] FDA and has been authorized for detection and/or diagnosis of SARS-CoV-2 by FDA under an Emergency Use Authorization (EUA). This EUA will remain in effect (meaning this test can be used) for the duration of the COVID-19 declaration under Section 564(b)(1) of the Act, 21 U.S.C. section 360bbb-3(b)(1), unless the authorization is terminated or revoked.     Resp Syncytial Virus by PCR NEGATIVE NEGATIVE Final    Comment: (NOTE) Fact Sheet for Patients: EntrepreneurPulse.com.au  Fact Sheet for Healthcare Providers: IncredibleEmployment.be  This test is not yet approved or cleared by the [place] FDA and has been authorized for detection and/or diagnosis of SARS-CoV-2 by FDA under an Emergency Use  Authorization (EUA). This EUA will remain in effect (meaning this test can be used) for the duration of the COVID-19 declaration under Section 564(b)(1) of the Act, 21 U.S.C. section 360bbb-3(b)(1), unless the authorization is terminated or revoked.  Performed at [facility], [address] [place], [place] [address]   Blood Culture ID Panel (Reflexed)     Status: Abnormal   Collection Time: 01/26/23  3:48 AM  Result Value Ref Range Status   Enterococcus faecalis NOT DETECTED NOT DETECTED Final   Enterococcus Faecium NOT DETECTED NOT DETECTED Final   Listeria monocytogenes NOT DETECTED NOT DETECTED Final   Staphylococcus species NOT DETECTED NOT DETECTED Final   Staphylococcus aureus (BCID)  NOT DETECTED NOT DETECTED Final   Staphylococcus epidermidis NOT DETECTED NOT DETECTED Final   Staphylococcus lugdunensis NOT DETECTED NOT DETECTED Final   Streptococcus species NOT DETECTED NOT DETECTED Final   Streptococcus agalactiae NOT DETECTED NOT DETECTED Final   Streptococcus pneumoniae NOT DETECTED NOT DETECTED Final   Streptococcus pyogenes NOT DETECTED NOT DETECTED Final   A.calcoaceticus-baumannii NOT DETECTED NOT DETECTED Final   Bacteroides fragilis NOT DETECTED NOT DETECTED Final   Enterobacterales DETECTED (A) NOT DETECTED Final    Comment: Enterobacterales represent a large order of gram negative bacteria, not a single organism. CRITICAL RESULT CALLED TO, READ BACK BY AND VERIFIED WITH: PHARMD M. LILLISTON 01/27/23 @ 0126 BY AB    Enterobacter cloacae complex NOT DETECTED NOT DETECTED Final   Escherichia coli DETECTED (A) NOT DETECTED Final    Comment: CRITICAL RESULT CALLED TO, READ BACK BY AND VERIFIED WITH: PHARMD M. LILLISTON 01/27/23 @ 0126 BY AB    Klebsiella aerogenes NOT DETECTED NOT DETECTED Final   Klebsiella oxytoca NOT DETECTED NOT DETECTED Final   Klebsiella pneumoniae NOT DETECTED NOT DETECTED Final   Proteus species NOT DETECTED NOT DETECTED  Final   Salmonella species NOT DETECTED NOT DETECTED Final   Serratia marcescens NOT DETECTED NOT DETECTED Final   Haemophilus influenzae NOT DETECTED NOT DETECTED Final   Neisseria meningitidis NOT DETECTED NOT DETECTED Final   Pseudomonas aeruginosa NOT DETECTED NOT DETECTED Final   Stenotrophomonas maltophilia NOT DETECTED NOT DETECTED Final   Candida albicans NOT DETECTED NOT DETECTED Final   Candida auris NOT DETECTED NOT DETECTED Final   Candida glabrata NOT DETECTED NOT DETECTED Final   Candida krusei NOT DETECTED NOT DETECTED Final   Candida parapsilosis NOT DETECTED NOT DETECTED Final   Candida tropicalis NOT DETECTED NOT DETECTED Final   Cryptococcus neoformans/gattii NOT DETECTED NOT DETECTED Final   CTX-M ESBL DETECTED (A) NOT DETECTED Final    Comment: CRITICAL RESULT CALLED TO, READ BACK BY AND VERIFIED WITH: PHARMD M. LILLISTON 01/27/23 @ 0126 BY AB (NOTE) Extended spectrum beta-lactamase detected. Recommend a carbapenem as initial therapy.      Carbapenem resistance IMP NOT DETECTED NOT DETECTED Final   Carbapenem resistance KPC NOT DETECTED NOT DETECTED Final   Carbapenem resistance NDM NOT DETECTED NOT DETECTED Final   Carbapenem resist OXA 48 LIKE NOT DETECTED NOT DETECTED Final   Carbapenem resistance VIM NOT DETECTED NOT DETECTED Final    Comment: Performed at [facility], [place] [address], [place], [place] [address]  Blood Culture (routine x 2)     Status: Abnormal (Preliminary result)   Collection Time: 01/26/23  3:59 AM   Specimen: BLOOD  Result Value Ref Range Status   Specimen Description   Final    BLOOD Performed at [facility], [address], [place], [place] [address]    Special Requests   Final    NONE Performed at [facility], [address] [place], [place] [address]    Culture  Setup Time   Final    GRAM NEGATIVE RODS AEROBIC BOTTLE ONLY CRITICAL VALUE NOTED.  VALUE IS CONSISTENT WITH  PREVIOUSLY REPORTED AND CALLED VALUE.    Culture (A)  Final    ESCHERICHIA COLI SUSCEPTIBILITIES TO FOLLOW Performed at [facility], [place] [address] [place],  [address]    Report Status PENDING  Incomplete  MRSA Next Gen by PCR, Nasal     Status: None   Collection Time: 01/26/23 10:00 AM  Specimen: Nasal Mucosa; Nasal Swab  Result Value Ref Range Status   MRSA by PCR Next Gen NOT DETECTED NOT DETECTED Final    Comment: (NOTE) The GeneXpert MRSA Assay (FDA approved for NASAL specimens only), is one component of a comprehensive MRSA colonization surveillance program. It is not intended to diagnose MRSA infection nor to guide or monitor treatment for MRSA infections. Test performance is not FDA approved in patients less than 79 years old. Performed at [facility], [place] [address], [place], [place] [address]          Radiology Studies: ECHOCARDIOGRAM COMPLETE  Result Date: 01/27/2023    ECHOCARDIOGRAM REPORT   Patient Name:   Trevor Wilson. Date of Exam: 01/27/2023 Medical Rec #:  [id number]               Height:       70.0 in Accession #:    [id number]              Weight:       174.8 lb Date of Birth:  14-Nov-1951               BSA:          1.971 m Patient Age:    10 years                BP:           125/59 mmHg Patient Gender: M                       HR:           87 bpm. Exam Location:  Inpatient Procedure: 2D Echo, 3D Echo, Color Doppler and Cardiac Doppler Indications:     dyspnea  History:         Patient has no prior history of Echocardiogram examinations.  Sonographer:     Harvie Junior Referring Phys:  [id number] [place] Diagnosing Phys: Dixie Dials MD IMPRESSIONS  1. Left ventricular ejection fraction, by estimation, is 55 to 60%. The left ventricle has normal function. The left ventricle has no regional wall motion abnormalities. Left ventricular diastolic parameters are consistent with Grade I diastolic dysfunction (impaired  relaxation).  2. Right ventricular systolic function is normal. The right ventricular size is normal. There is moderately elevated pulmonary artery systolic pressure.  3. Left atrial size was mildly dilated.  4. Right atrial size was mildly dilated.  5. The mitral valve is normal in structure. Mild mitral valve regurgitation.  6. The aortic valve is normal in structure. Aortic valve regurgitation is not visualized.  7. There is mild (Grade II) atheroma plaque involving the ascending aorta.  8. The inferior vena cava is normal in size with greater than 50% respiratory variability, suggesting right atrial pressure of 3 mmHg. FINDINGS  Left Ventricle: Left ventricular ejection fraction, by estimation, is 55 to 60%. The left ventricle has normal function. The left ventricle has no regional wall motion abnormalities. The left ventricular internal cavity size was normal in size. There is  no left ventricular hypertrophy. Left ventricular diastolic parameters are consistent with Grade I diastolic dysfunction (impaired relaxation). Right Ventricle: The right ventricular size is normal. No increase in right ventricular wall thickness. Right ventricular systolic function is normal. There is moderately elevated pulmonary artery systolic pressure. The tricuspid regurgitant velocity is 3.49 m/s, and with an assumed right atrial pressure of 3 mmHg, the estimated right ventricular systolic pressure  is 51.7 mmHg. Left Atrium: Left atrial size was mildly dilated. Right Atrium: Right atrial size was mildly dilated. Pericardium: There is no evidence of pericardial effusion. Mitral Valve: The mitral valve is normal in structure. Mild mitral valve regurgitation. Tricuspid Valve: The tricuspid valve is normal in structure. Tricuspid valve regurgitation is mild. Aortic Valve: The aortic valve is normal in structure. Aortic valve regurgitation is not visualized. Aortic valve mean gradient measures 3.0 mmHg. Aortic valve peak gradient  measures 5.6 mmHg. Aortic valve area, by VTI measures 3.05 cm. Pulmonic Valve: The pulmonic valve was normal in structure. Pulmonic valve regurgitation is not visualized. Aorta: The aortic root is normal in size and structure. There is mild (Grade II) atheroma plaque involving the ascending aorta. Venous: The inferior vena cava is normal in size with greater than 50% respiratory variability, suggesting right atrial pressure of 3 mmHg. IAS/Shunts: The atrial septum is grossly normal.  LEFT VENTRICLE PLAX 2D LVIDd:         5.40 cm      Diastology LVIDs:         3.50 cm      LV e' medial:    13.20 cm/s LV PW:         1.00 cm      LV E/e' medial:  5.2 LV IVS:        1.00 cm      LV e' lateral:   16.50 cm/s LVOT diam:     2.20 cm      LV E/e' lateral: 4.2 LV SV:         59 LV SV Index:   30 LVOT Area:     3.80 cm                              3D Volume EF: LV Volumes (MOD)            3D EF:        63 % LV vol d, MOD A2C: 123.0 ml LV EDV:       154 ml LV vol d, MOD A4C: 107.0 ml LV ESV:       57 ml LV vol s, MOD A2C: 44.0 ml  LV SV:        96 ml LV vol s, MOD A4C: 42.7 ml LV SV MOD A2C:     79.0 ml LV SV MOD A4C:     107.0 ml LV SV MOD BP:      73.3 ml RIGHT VENTRICLE RV Basal diam:  4.10 cm RV Mid diam:    3.30 cm RV S prime:     14.90 cm/s TAPSE (M-mode): 2.2 cm LEFT ATRIUM           Index        RIGHT ATRIUM           Index LA diam:      3.30 cm 1.67 cm/m   RA Area:     15.00 cm LA Vol (A4C): 57.4 ml 29.12 ml/m  RA Volume:   40.20 ml  20.39 ml/m  AORTIC VALVE                    PULMONIC VALVE AV Area (Vmax):    3.10 cm     PV Vmax:          1.03 m/s AV Area (Vmean):   3.10 cm     PV Peak  grad:     4.2 mmHg AV Area (VTI):     3.05 cm     PR End Diast Vel: 5.86 msec AV Vmax:           118.00 cm/s AV Vmean:          78.200 cm/s AV VTI:            0.192 m AV Peak Grad:      5.6 mmHg AV Mean Grad:      3.0 mmHg LVOT Vmax:         96.30 cm/s LVOT Vmean:        63.800 cm/s LVOT VTI:          0.154 m LVOT/AV VTI ratio:  0.80  AORTA Ao Root diam: 3.70 cm Ao Asc diam:  3.10 cm MITRAL VALVE               TRICUSPID VALVE MV Area (PHT): 4.06 cm    TR Peak grad:   48.7 mmHg MV Decel Time: 187 msec    TR Vmax:        349.00 cm/s MR Peak grad: 49.6 mmHg MR Vmax:      352.00 cm/s  SHUNTS MV E velocity: 68.60 cm/s  Systemic VTI:  0.15 m MV A velocity: 77.50 cm/s  Systemic Diam: 2.20 cm MV E/A ratio:  0.89 Dixie Dials MD Electronically signed by Dixie Dials MD Signature Date/Time: 01/27/2023/5:18:56 PM    Final    CT Angio Chest Pulmonary Embolism (PE) W or WO Contrast  Result Date: 01/26/2023 CLINICAL DATA:  High probability for PE. Status post prostatectomy. Shortness of breath and fever. EXAM: CT ANGIOGRAPHY CHEST WITH CONTRAST TECHNIQUE: Multidetector CT imaging of the chest was performed using the standard protocol during bolus administration of intravenous contrast. Multiplanar CT image reconstructions and MIPs were obtained to evaluate the vascular anatomy. RADIATION DOSE REDUCTION: This exam was performed according to the departmental dose-optimization program which includes automated exposure control, adjustment of the mA and/or kV according to patient size and/or use of iterative reconstruction technique. CONTRAST:  78m OMNIPAQUE IOHEXOL 350 MG/ML SOLN COMPARISON:  CT abdomen and pelvis same day FINDINGS: Cardiovascular: Satisfactory opacification of the pulmonary arteries to the segmental level. No evidence of pulmonary embolism. Normal heart size. No pericardial effusion. There are atherosclerotic calcifications of the aorta. Mediastinum/Nodes: There are enlarged paratracheal lymph nodes measuring up to 10 mm short axis. Enlarged subcarinal lymph node measures 12 mm. Enlarged right hilar lymph node measures 15 mm. Enlarged left hilar lymph node measures 12 mm. Esophagus is within normal limits. There is a small hiatal hernia. Visualized thyroid gland is within normal limits. Lungs/Pleura: There are trace bilateral pleural  effusions. There some scattered patchy ground-glass opacities in both mid and lower lungs with some smooth interlobular septal thickening in the lung bases. Trachea and central airways are patent. No pneumothorax. Upper Abdomen: No acute abnormality. Musculoskeletal: There is mild compression deformity of the superior endplate of T7 which is age indeterminate. Review of the MIP images confirms the above findings. IMPRESSION: 1. No evidence for pulmonary embolism. 2. Trace bilateral pleural effusions. 3. Patchy ground-glass opacities in the mid and lower lungs with smooth interlobular septal thickening in the lung bases worrisome for pulmonary edema. 4. Mediastinal and bilateral hilar lymphadenopathy of uncertain etiology. 5. Age-indeterminate T7 compression deformity. Aortic Atherosclerosis (ICD10-I70.0). Electronically Signed   By: ARonney AstersM.D.   On: 01/26/2023 21:33        Scheduled  Meds:  aspirin EC  81 mg Oral Daily   Chlorhexidine Gluconate Cloth  6 each Topical Daily   pantoprazole  40 mg Oral Daily   rosuvastatin  5 mg Oral Daily   Continuous Infusions:  heparin 1,350 Units/hr (01/28/23 1531)   meropenem (MERREM) IV Stopped (01/28/23 1000)     LOS: 2 days    Time spent: 45 minutes    Irine Seal, MD [facility]   To contact the attending provider between 7A-7P or the covering provider during after hours 7P-7A, please log into the web site [email or web address] and access using universal [place] password for that web site. If you do not have the password, please call the hospital operator.  01/28/2023, 4:45 PM

## 2023-01-29 ENCOUNTER — Inpatient Hospital Stay (HOSPITAL_COMMUNITY): Payer: PPO

## 2023-01-29 DIAGNOSIS — N12 Tubulo-interstitial nephritis, not specified as acute or chronic: Secondary | ICD-10-CM | POA: Diagnosis not present

## 2023-01-29 DIAGNOSIS — A419 Sepsis, unspecified organism: Secondary | ICD-10-CM | POA: Diagnosis not present

## 2023-01-29 DIAGNOSIS — E8770 Fluid overload, unspecified: Secondary | ICD-10-CM | POA: Diagnosis not present

## 2023-01-29 DIAGNOSIS — N390 Urinary tract infection, site not specified: Secondary | ICD-10-CM | POA: Diagnosis not present

## 2023-01-29 DIAGNOSIS — I214 Non-ST elevation (NSTEMI) myocardial infarction: Secondary | ICD-10-CM | POA: Diagnosis not present

## 2023-01-29 LAB — CULTURE, BLOOD (ROUTINE X 2)

## 2023-01-29 LAB — CBC
HCT: 25.7 % — ABNORMAL LOW (ref 39.0–52.0)
Hemoglobin: 8.9 g/dL — ABNORMAL LOW (ref 13.0–17.0)
MCH: 33.2 pg (ref 26.0–34.0)
MCHC: 34.6 g/dL (ref 30.0–36.0)
MCV: 95.9 fL (ref 80.0–100.0)
Platelets: 166 K/uL (ref 150–400)
RBC: 2.68 MIL/uL — ABNORMAL LOW (ref 4.22–5.81)
RDW: 11.8 % (ref 11.5–15.5)
WBC: 4.5 K/uL (ref 4.0–10.5)
nRBC: 0 % (ref 0.0–0.2)

## 2023-01-29 LAB — HEPARIN LEVEL (UNFRACTIONATED): Heparin Unfractionated: 0.33 [IU]/mL (ref 0.30–0.70)

## 2023-01-29 LAB — COMPREHENSIVE METABOLIC PANEL
ALT: 66 U/L — ABNORMAL HIGH (ref 0–44)
AST: 61 U/L — ABNORMAL HIGH (ref 15–41)
Albumin: 2.6 g/dL — ABNORMAL LOW (ref 3.5–5.0)
Alkaline Phosphatase: 193 U/L — ABNORMAL HIGH (ref 38–126)
Anion gap: 10 (ref 5–15)
BUN: 10 mg/dL (ref 8–23)
CO2: 22 mmol/L (ref 22–32)
Calcium: 8 mg/dL — ABNORMAL LOW (ref 8.9–10.3)
Chloride: 99 mmol/L (ref 98–111)
Creatinine, Ser: 0.44 mg/dL — ABNORMAL LOW (ref 0.61–1.24)
GFR, Estimated: >60 mL/min (ref >60)
Glucose, Bld: 96 mg/dL (ref 70–99)
Potassium: 3.7 mmol/L (ref 3.5–5.1)
Sodium: 131 mmol/L — ABNORMAL LOW (ref 135–145)
Total Bilirubin: 0.9 mg/dL (ref 0.3–1.2)
Total Protein: 5.4 g/dL — ABNORMAL LOW (ref 6.5–8.1)

## 2023-01-29 LAB — HEPATITIS PANEL, ACUTE
HCV Ab: NONREACTIVE
Hep A IgM: NONREACTIVE
Hep B C IgM: NONREACTIVE
Hepatitis B Surface Ag: NONREACTIVE

## 2023-01-29 LAB — GLUCOSE, CAPILLARY: Glucose-Capillary: 89 mg/dL (ref 70–99)

## 2023-01-29 LAB — MAGNESIUM: Magnesium: 1.9 mg/dL (ref 1.7–2.4)

## 2023-01-29 LAB — BRAIN NATRIURETIC PEPTIDE: B Natriuretic Peptide: 361.4 pg/mL — ABNORMAL HIGH (ref 0.0–100.0)

## 2023-01-29 MED ORDER — ALPRAZOLAM 0.25 MG PO TABS
0.2500 mg | ORAL_TABLET | Freq: Every evening | ORAL | Status: AC | PRN
  Administered 2023-01-29: 0.25 mg via ORAL
  Filled 2023-01-29: qty 1

## 2023-01-29 MED ORDER — SODIUM CHLORIDE 0.9% FLUSH: 10.0000 mL | INTRAVENOUS | Status: DC | PRN

## 2023-01-29 MED ORDER — POTASSIUM CHLORIDE CRYS ER 20 MEQ PO TBCR
40.0000 meq | EXTENDED_RELEASE_TABLET | Freq: Once | ORAL | Status: AC
  Administered 2023-01-29: 40 meq via ORAL
  Filled 2023-01-29: qty 2

## 2023-01-29 MED ORDER — SODIUM CHLORIDE 0.9% FLUSH
10.0000 mL | Freq: Two times a day (BID) | INTRAVENOUS | Status: DC
  Administered 2023-01-29 – 2023-01-30 (×2): 10 mL

## 2023-01-29 MED ORDER — SODIUM CHLORIDE 0.9 % IV SOLN
1.0000 g | INTRAVENOUS | Status: DC
  Administered 2023-01-29 – 2023-01-30 (×2): 1000 mg via INTRAVENOUS
  Filled 2023-01-29 (×2): qty 1

## 2023-01-29 MED ORDER — FUROSEMIDE 10 MG/ML IJ SOLN
40.0000 mg | Freq: Once | INTRAMUSCULAR | Status: AC
  Administered 2023-01-29: 40 mg via INTRAVENOUS
  Filled 2023-01-29: qty 4

## 2023-01-29 MED ORDER — ENOXAPARIN SODIUM 40 MG/0.4ML IJ SOSY
40.0000 mg | PREFILLED_SYRINGE | Freq: Every day | INTRAMUSCULAR | Status: DC
  Administered 2023-01-29 – 2023-01-30 (×2): 40 mg via SUBCUTANEOUS
  Filled 2023-01-29 (×2): qty 0.4

## 2023-01-29 NOTE — Progress Notes (Addendum)
ID Brief Note   Remains afebrile, no leukocytosis Cardiology following for a pause noted on telemetry last night  US abdomen 3/14 unremarkable  Some fluctuating transaminitis, acute hepatitis panel is pending   Susceptibilities of ESBL E coli from Blood and urine cx reviewed. I to Ciprofloxacin from blood isolate.  ID pharmacy to place OPAT orders as below D/w ID Pharm D and primary  ID will SO  Diagnosis: Pyelonephritis and bacteremia   Culture Result: ESBL E coli   No Known Allergies  OPAT Orders Discharge antibiotics to be given via PICC line Discharge antibiotics: ertapenem 1g iv q24hrs daily Per pharmacy protocol  Duration: 10 days  End Date: 02/05/23  [facility] Care Per Protocol:  Home health RN for IV administration and teaching; PICC line care and labs.    Labs weekly while on IV antibiotics: X__ CBC with differential __ BMP X__ CMP __ CRP __ ESR __ Vancomycin trough __ CK  X_ Please pull PIC at completion of IV antibiotics __ Please leave PIC in place until doctor has seen patient or been notified  Fax weekly labs to [phone]  Clinic Follow Up Appt: 1-2 weeks   Rosiland Oz, MD Infectious Disease Physician [facility] [address] [place], [place] [address] Phone: [phone]  Fax: [phone]

## 2023-01-29 NOTE — Progress Notes (Signed)
Cardiology signed off on the patient yesterday with medication recommendations.  This to review his telemetry.  I reviewed his telemetry which shows sinus rhythm throughout with some intermittent PACs.  There is concern for a pause which upon my review appears to be a nonconducted PAC.  Review EKGs which has been done over the last 24 hours all show sinus rhythm.  Patient is nonsymptomatic.    Spoke with patient and his wife all of their questions has been answered about what a PAC means. Also spoke with the hospitalist.  As noted yesterday when our team signed off the patient will follow-up with Dr. Johney Frame in 4 to 6 weeks.

## 2023-01-29 NOTE — TOC Initial Note (Addendum)
Transition of Care (TOC) - Initial/Assessment Note    Patient Details  Name: Trevor Wilson. MRN: [id number] Date of Birth: 07-31-1951  Transition of Care [facility]) CM/SW Contact:    Roseanne Kaufman, RN Phone Number: 01/29/2023, 12:03 PM  Clinical Narrative:     Per chart review patient currently being followed by ID for IV abx. This RNCM spoke with Pam with [facility] who will follow for IV abx. Patient currently unavailable, left voicemail with patient's wife to offer choice for home health agency. Awaiting a call back  TOC will continue to following             - 12:31pm This RNCM spoke with patient and wife Trevor Wilson at bedside to offer choice for [facility] services. Patient will accept any Deputy agency to accept his insurance. This RNCM notified Amy with [facility], who is unable accept patient, Notified Cory with [facility], awaiting a response.  TOC will continue to follow.  - 1:15pm Cory with [facility] will follow patient for [facility] for home IV abx. Notified MD for [facility] orders.  Notified Pam with [facility] that [facility] with [facility] is following for [facility] needs. Awaiting HH orders.  1:40pm Per Pam with [facility] she will come to the hospital to teach patient regarding home IV abx around 4pm-4:30pm today. This RNCM notified MD, RN, awaiting response regarding EDD for patient.  TOC will continue to follow.  - 2:23 pm MD reports patient not medically stable for discharge today. TOC will continue to follow. Awaiting HHRN orders.   Expected Discharge Plan: [place] Barriers to Discharge: Continued Medical Work up   Patient Goals and CMS Choice Patient states their goals for this hospitalization and ongoing recovery are:: return home CMS Medicare.gov Compare Post Acute Care list provided to:: Patient Choice offered to / list presented to : Patient [place] ownership interest in [facility].provided to:: Patient    Expected Discharge Plan and Services In-house Referral:  NA Discharge Planning Services: CM Consult Post Acute Care Choice: [place] arrangements for the past 2 months: Single Family Home                           HH Arranged: RN, IV Antibiotics HH Agency: [facility] Date HH Agency Contacted: 01/29/23 Time [place]: 1202 Representative spoke with at [place]: [place] with [facility]  Prior Living Arrangements/Services Living arrangements for the past 2 months: [place] with:: Spouse Patient language and need for interpreter reviewed:: Yes Do you feel safe going back to the place where you live?: Yes      Need for Family Participation in Patient Care: No (Comment) Care giver support system in place?: Yes (comment) Current home services: Other (comment) (none) Criminal Activity/Legal Involvement Pertinent to Current Situation/Hospitalization: No - Comment as needed  Activities of Daily Living Home Assistive Devices/Equipment: None ADL Screening (condition at time of admission) Patient's cognitive ability adequate to safely complete daily activities?: Yes Is the patient deaf or have difficulty hearing?: No Does the patient have difficulty seeing, even when wearing glasses/contacts?: No Does the patient have difficulty concentrating, remembering, or making decisions?: No Patient able to express need for assistance with ADLs?: Yes Does the patient have difficulty dressing or bathing?: No Independently performs ADLs?: Yes (appropriate for developmental age) Does the patient have difficulty walking or climbing stairs?: No Weakness of Legs: None Weakness of Arms/Hands: None  Permission Sought/Granted Permission sought to share  information with : Case Manager Permission granted to share information with : Yes, Verbal Permission Granted  Share Information with NAME: Case manager           Emotional Assessment Appearance:: Appears stated age Attitude/Demeanor/Rapport: Unable to Assess Affect (typically  observed): Unable to Assess     Psych Involvement: No (comment)  Admission diagnosis:  Pyelonephritis [N12] Sepsis secondary to UTI ([place]) [A41.9, N39.0] Sepsis without acute organ dysfunction, due to unspecified organism [facility]) [A41.9] Patient Active Problem List   Diagnosis Date Noted   Pyelonephritis 01/28/2023   NSTEMI (non-ST elevated myocardial infarction) ([place]) 01/28/2023   Hypervolemia 01/28/2023   Infection due to ESBL-producing Escherichia coli 01/28/2023   UTI due to extended-spectrum beta lactamase (ESBL) producing Escherichia coli 01/28/2023   Gastroesophageal reflux disease 01/28/2023   Hyponatremia 01/28/2023   Normocytic anemia 01/28/2023   Sepsis secondary to UTI ([place]) 01/26/2023   Prostate cancer ([employer]) 01/07/2023   Malignant neoplasm of prostate ([place]) 11/19/2022   PCP:  Lujean Amel, MD Pharmacy:   [facility], [place] - [address] [place] [address] Phone: [phone] Fax: [phone]     Social Determinants of Health (SDOH) Social History: SDOH Screenings   Food Insecurity: No Food Insecurity (01/26/2023)  Housing: Low Risk  (01/26/2023)  Transportation Needs: No Transportation Needs (01/26/2023)  Utilities: Not At Risk (01/26/2023)  Tobacco Use: Low Risk  (01/26/2023)   SDOH Interventions:     Readmission Risk Interventions     No data to display

## 2023-01-29 NOTE — Progress Notes (Signed)
Patient had nosebleed, about 7-10 drops. He stood up to use the urinal and noticed the drops while looking down. It stopped by the time he called me to his room. No other symptoms.

## 2023-01-29 NOTE — Progress Notes (Signed)
PHARMACY CONSULT NOTE FOR:  OUTPATIENT  PARENTERAL ANTIBIOTIC THERAPY (OPAT)  Indication: ESBL bacteremia/pyelo Regimen: Ertapenem 1g IV every 24 hours End date: 02/05/23  IV antibiotic discharge orders are pended. To discharging provider:  please sign these orders via discharge navigator,  Select New Orders & click on the button choice - Manage This Unsigned Work.     Thank you for allowing pharmacy to be a part of this patient's care.  Alycia Rossetti, PharmD, BCPS Infectious Diseases Clinical Pharmacist 01/29/2023 10:48 AM   **Pharmacist phone directory can now be found on [email or web address] ([email or web address]).  Listed under [place].

## 2023-01-29 NOTE — Progress Notes (Signed)
PROGRESS NOTE    Trevor Wilson.  [id number] DOB: 08/19/1951 DOA: 01/26/2023 PCP: Lujean Amel, MD    Chief Complaint  Patient presents with   Shortness of Breath    Brief Narrative:  Patient 72 year old gentleman history of arthritis, GERD, grade 1 prostate cancer diagnosed 11/05/2020 status post truce biopsy with progression to grade 1-3 10/2022 as well as LUTS despite use of twice daily Flomax.  Patient noted in the interim to have a prostatectomy and node dissection done in January 07 2023, and did well postoperatively.  Patient presented to [place] 01/26/2023 with shortness of breath in the setting of high fevers and rigors.  Patient denies any cough chest pain upper respiratory symptoms.  Patient denies any dysuria diarrhea abdominal pain.  Patient noted on presentation to have a temp of 101.2, BP 115/68 with a heart rate of 105 respiratory rate 22.  CT abdomen and pelvis done showed bilateral striated nephrograms concerning for acute pyelonephritis, status post prostatectomy.  Patient admitted, pancultured placed empirically on IV antibiotics.  Blood cultures came back positive for ESBL E. coli bacteremia, urine cultures positive for E. coli.  Patient initially was on IV cefepime and transition to IV meropenem.  Patient also noted to be in volume overload, and concern for elevated troponin/non-STEMI seen by cardiology, diuresed.  ID also consulted.   Assessment & Plan:   Principal Problem:   Sepsis secondary to UTI [facility]) Active Problems:   Pyelonephritis   NSTEMI (non-ST elevated myocardial infarction) ([facility])   Hypervolemia   Infection due to ESBL-producing Escherichia coli   UTI due to extended-spectrum beta lactamase (ESBL) producing Escherichia coli   Gastroesophageal reflux disease   Hyponatremia   Normocytic anemia  #1 severe sepsis secondary to pyelonephritis and ESBL E. coli bacteremia -Patient admitted with rigors, high fevers, shortness of  breath in the setting of recent prostatectomy 01/07/2023. -CT abdomen and pelvis done concerning for bilateral acute pyelonephritis. -Urinalysis done concerning for UTI. -Blood cultures now positive for ESBL E. coli bacteremia, urine cultures with ESBL E. coli. -Patient with a leukocytosis on admission which has trended down. -Fever curve trending down. -Patient improving clinically. -Was initially on IV vancomycin and IV cefepime and IV antibiotics changed to IV meropenem. -Due to ESBL bacteremia and ESBL E. coli in the urine, ID consulted for further evaluation, recommendations and management. -ID recommending patient will need at least a 10-day course of IV Merrem. -Will order midline to be placed.  2.  Volume overload -Felt likely secondary to sepsis in the setting of aggressive fluid resuscitation. -EKG done with no ischemic changes noted. -CT angiogram chest done negative for PE. -2D echo done with EF of 55 to [id number], grade 1 diastolic dysfunction.  Normal right ventricular systolic function.  Moderately elevated pulmonary artery systolic pressure.  Mildly dilated left atrial size.  Mildly dilated right atrial size.  Grade 2 atheroma plaque involving the ascending aorta. -Patient received IV Lasix prior cardiology recommendations and patient with 700 cc of urine output recorded over the past 24 hours however doubt accuracy of urine output. -Patient improved clinically with sats of 97% on 1 L nasal cannula. -Patient with some complaints of abdominal fullness, trial of Lasix 40 mg IV x 1 today. -No further recommendations per cardiology, cardiology is following.  3.  Non-STEMI/elevated troponins -Felt likely secondary to demand ischemia secondary to problem #1. -CT angiogram chest negative for PE. --2D echo done with EF of 55 to [id number], grade 1 diastolic dysfunction.  Normal right ventricular systolic function.  Moderately elevated pulmonary artery systolic pressure.  Mildly dilated  left atrial size.  Mildly dilated right atrial size.  Grade 2 atheroma plaque involving the ascending aorta. -Cardiology recommending to complete 48 hours of heparin and to continue aspirin and statin. -Discontinue heparin. -Patient currently denies any chest pain. -Cardiology recommending outpatient coronary CTA with Dr. Johney Frame. -Outpatient follow-up. -Appreciate cardiology input and recommendations.  4.  Status post prostatectomy 01/07/2023 -CT abdomen and pelvis done with concern for bilateral acute pyelonephritis status post prostatectomy.  No abnormal fluid collections identified within the prostate bed.  Small volume of free fluid identified within the pelvis. -Patient being treated with IV antibiotics for pyelonephritis. -Outpatient follow-up with urology.  5.  Transaminitis/elevated alk phosphatase -Likely in the setting of volume overload and sepsis. -LFTs fluctuating.  -Acute hepatitis panel pending.   -Check a right upper quadrant ultrasound.   -Repeat labs in the AM.   6.  Hyponatremia -In the setting of diuresis. -Improving. -Follow for now. -Repeat labs in the AM.  7.-Hyperlipidemia -Statin initially held and recommended to be resumed per cardiology. -Continue Crestor.  8.  Normocytic anemia -Anemia panel done with iron level of 9, TIBC of 192, ferritin of 366, folate of 22.6, vitamin B12 of 1018. -Hemoglobin stable at 8.9.  -Transfusion threshold hemoglobin < 7.  9.  Overweight -BMI approximately 25.08 kg/g/m. -Lifestyle modification -Outpatient follow-up with PCP.  10.  GERD -Continue PPI.  11.  Transient hypoxia/0.9-second pause -Patient noted to have a transient hypoxia yesterday which has subsequently since resolved. -Patient noted on telemetry to have an approximately dropped beat/0.9-second pause. -Not on AV nodal blocking agents. -Follow for now.   DVT prophylaxis: Heparin drip>>>> Lovenox Code Status: Full Family Communication: Updated  patient, no family at bedside. Disposition: Transfer to progressive care floor.  Home when clinically improved and cleared by ID.   Status is: Inpatient Remains inpatient appropriate because: Severity of illness   Consultants:  ID: Dr.Manandhar 01/28/2023 Surgery: Dr. Johney Frame 01/27/2023  Procedures:  CT angiogram chest 01/26/2023 Chest x-ray 01/26/2023 2D echo 01/27/2023   Antimicrobials:  IV cefepime 01/26/2023>>>> 01/27/2023 IV Merrem 01/27/2023>>>> IV Flagyl x 1 dose 01/26/2023 IV vancomycin 311 2024 x 1 dose   Subjective: Sitting up in recliner.  Stated slept well last night after receiving a small dose of anxiolytics.  States he has been having trouble sleeping for the past 10 years.  Denies any chest pain.  No significant shortness of breath.  Complaining of having a fullness.  No nausea or vomiting.  Seems like he had a decreased appetite.  No further hypoxic episodes overnight.   Objective: Vitals:   01/29/23 0500 01/29/23 0600 01/29/23 0800 01/29/23 0840  BP:  127/75 115/81   Pulse: (!) 58 (!) 55 (!) 59   Resp: 19 (!) 21 14   Temp:   97.9 F (36.6 C) 97.9 F (36.6 C)  TempSrc:   Axillary Axillary  SpO2: 97% 100% 98%   Weight:      Height:        Intake/Output Summary (Last 24 hours) at 01/29/2023 1033 Last data filed at 01/29/2023 0800 Gross per 24 hour  Intake 834.22 ml  Output 1075 ml  Net -240.78 ml    Filed Weights   01/26/23 1000 01/27/23 0326 01/28/23 0500  Weight: 77 kg 79.3 kg 77.4 kg    Examination:  General exam: NAD. Respiratory system: Lungs clear to auscultation bilaterally.  No wheezes, no crackles, no rhonchi.  Fair air movement.  Speaking in full sentences.  Normal respiratory effort.   Cardiovascular system: RRR no murmurs rubs or gallops.  No JVD.  No lower extremity edema.  Gastrointestinal system: Abdomen is soft, nontender, nondistended, positive bowel sounds.  No rebound.  No guarding.  Central nervous system: Alert and oriented. No  focal neurological deficits. Extremities: Symmetric 5 x 5 power. Skin: No rashes, lesions or ulcers Psychiatry: Judgement and insight appear normal. Mood & affect appropriate.     Data Reviewed: I have personally reviewed following labs and imaging studies  CBC: Recent Labs  Lab 01/26/23 0348 01/26/23 1051 01/27/23 0044 01/28/23 0311 01/29/23 0310  WBC 4.3 12.9* 7.2 5.1 4.5  NEUTROABS 3.8 12.2*  --  3.7  --   HGB 10.3* 9.4* 8.3* 8.9* 8.9*  HCT 29.2* 27.5* 25.1* 26.0* 25.7*  MCV 93.6 96.5 98.8 95.9 95.9  PLT 189 159 137* 136* 166     Basic Metabolic Panel: Recent Labs  Lab 01/26/23 0348 01/26/23 1051 01/27/23 0044 01/28/23 0311 01/29/23 0310  NA 130* 131* 130* 128* 131*  K 4.5 3.8 4.0 3.5 3.7  CL 98 97* 100 97* 99  CO2 17* '22 23 23 22  '$ GLUCOSE 136* 119* 108* 107* 96  BUN '19 16 14 12 10  '$ CREATININE 0.91 0.75 0.79 0.80 0.44*  CALCIUM 8.7* 8.1* 7.7* 7.8* 8.0*  MG  --   --   --  1.9 1.9  PHOS  --   --   --  3.2  --      GFR: Estimated Creatinine Clearance: 88.7 mL/min (A) (by C-G formula based on SCr of 0.44 mg/dL (L)).  Liver Function Tests: Recent Labs  Lab 01/26/23 0348 01/26/23 1051 01/27/23 0044 01/28/23 0311 01/29/23 0310  AST 95* 83* 69* 49* 61*  ALT 131* 109* 88* 68* 66*  ALKPHOS 172* 175* 164* 161* 193*  BILITOT 0.8 1.1 0.8 1.0 0.9  PROT 6.7 6.0* 5.3* 5.5* 5.4*  ALBUMIN 3.5 2.9* 2.6* 2.7* 2.6*     CBG: Recent Labs  Lab 01/27/23 0824 01/28/23 0747  GLUCAP 91 98      Recent Results (from the past 240 hour(s))  Blood Culture (routine x 2)     Status: Abnormal   Collection Time: 01/26/23  3:48 AM   Specimen: BLOOD  Result Value Ref Range Status   Specimen Description   Final    BLOOD Performed at [facility], [address] [place], [place] [address]    Special Requests   Final    NONE Performed at [facility], [address], [place], [place] [address]    Culture  Setup Time   Final     GRAM NEGATIVE RODS IN BOTH AEROBIC AND ANAEROBIC BOTTLES CRITICAL RESULT CALLED TO, READ BACK BY AND VERIFIED WITH: [place] 01/27/23 @ 0126 BY AB    Culture (A)  Final    ESCHERICHIA COLI SUSCEPTIBILITIES PERFORMED ON PREVIOUS CULTURE WITHIN THE LAST 5 DAYS. Performed at [facility], [place] [address] [place], [place] [address]    Report Status 01/29/2023 FINAL  Final  Urine Culture     Status: Abnormal   Collection Time: 01/26/23  3:48 AM   Specimen: Urine, Clean Catch  Result Value Ref Range Status   Specimen Description   Final    URINE, CLEAN CATCH Performed at [facility], [address] [place], [place] [address]    Special Requests   Final  NONE Performed at [facility], [address] [place], [place] [address]    Culture (A)  Final    50,000 COLONIES/mL ESCHERICHIA COLI Confirmed Extended Spectrum Beta-Lactamase Producer (ESBL).  In bloodstream infections from ESBL organisms, carbapenems are preferred over piperacillin/tazobactam. They are shown to have a lower risk of mortality.    Report Status 01/28/2023 FINAL  Final   Organism ID, Bacteria ESCHERICHIA COLI (A)  Final      Susceptibility   Escherichia coli - MIC*    AMPICILLIN >=32 RESISTANT Resistant     CEFAZOLIN >=64 RESISTANT Resistant     CEFEPIME 16 RESISTANT Resistant     CEFTRIAXONE >=64 RESISTANT Resistant     CIPROFLOXACIN <=0.25 SENSITIVE Sensitive     GENTAMICIN >=16 RESISTANT Resistant     IMIPENEM <=0.25 SENSITIVE Sensitive     NITROFURANTOIN <=16 SENSITIVE Sensitive     TRIMETH/SULFA >=320 RESISTANT Resistant     AMPICILLIN/SULBACTAM >=32 RESISTANT Resistant     PIP/TAZO <=4 SENSITIVE Sensitive     * 50,000 COLONIES/mL ESCHERICHIA COLI  Resp panel by RT-PCR (RSV, Flu A&B, Covid) Anterior Nasal Swab     Status: None   Collection Time: 01/26/23  3:48 AM   Specimen: Anterior Nasal Swab  Result Value Ref Range Status   SARS  Coronavirus 2 by RT PCR NEGATIVE NEGATIVE Final    Comment: (NOTE) SARS-CoV-2 target nucleic acids are NOT DETECTED.  The SARS-CoV-2 RNA is generally detectable in upper respiratory specimens during the acute phase of infection. The lowest concentration of SARS-CoV-2 viral copies this assay can detect is 138 copies/mL. A negative result does not preclude SARS-Cov-2 infection and should not be used as the sole basis for treatment or other patient management decisions. A negative result may occur with  improper specimen collection/handling, submission of specimen other than nasopharyngeal swab, presence of viral mutation(s) within the areas targeted by this assay, and inadequate number of viral copies(<138 copies/mL). A negative result must be combined with clinical observations, patient history, and epidemiological information. The expected result is Negative.  Fact Sheet for Patients:  EntrepreneurPulse.com.au  Fact Sheet for Healthcare Providers:  IncredibleEmployment.be  This test is no t yet approved or cleared by the [place] FDA and  has been authorized for detection and/or diagnosis of SARS-CoV-2 by FDA under an Emergency Use Authorization (EUA). This EUA will remain  in effect (meaning this test can be used) for the duration of the COVID-19 declaration under Section 564(b)(1) of the Act, 21 U.S.C.section 360bbb-3(b)(1), unless the authorization is terminated  or revoked sooner.       Influenza A by PCR NEGATIVE NEGATIVE Final   Influenza B by PCR NEGATIVE NEGATIVE Final    Comment: (NOTE) The Xpert Xpress SARS-CoV-2/FLU/RSV plus assay is intended as an aid in the diagnosis of influenza from Nasopharyngeal swab specimens and should not be used as a sole basis for treatment. Nasal washings and aspirates are unacceptable for Xpert Xpress SARS-CoV-2/FLU/RSV testing.  Fact Sheet for  Patients: EntrepreneurPulse.com.au  Fact Sheet for Healthcare Providers: IncredibleEmployment.be  This test is not yet approved or cleared by the [place] FDA and has been authorized for detection and/or diagnosis of SARS-CoV-2 by FDA under an Emergency Use Authorization (EUA). This EUA will remain in effect (meaning this test can be used) for the duration of the COVID-19 declaration under Section 564(b)(1) of the Act, 21 U.S.C. section 360bbb-3(b)(1), unless the authorization is terminated or revoked.     Resp Syncytial Virus by PCR  NEGATIVE NEGATIVE Final    Comment: (NOTE) Fact Sheet for Patients: EntrepreneurPulse.com.au  Fact Sheet for Healthcare Providers: IncredibleEmployment.be  This test is not yet approved or cleared by the [place] FDA and has been authorized for detection and/or diagnosis of SARS-CoV-2 by FDA under an Emergency Use Authorization (EUA). This EUA will remain in effect (meaning this test can be used) for the duration of the COVID-19 declaration under Section 564(b)(1) of the Act, 21 U.S.C. section 360bbb-3(b)(1), unless the authorization is terminated or revoked.  Performed at [facility], [address] [place], [place] [address]   Blood Culture ID Panel (Reflexed)     Status: Abnormal   Collection Time: 01/26/23  3:48 AM  Result Value Ref Range Status   Enterococcus faecalis NOT DETECTED NOT DETECTED Final   Enterococcus Faecium NOT DETECTED NOT DETECTED Final   Listeria monocytogenes NOT DETECTED NOT DETECTED Final   Staphylococcus species NOT DETECTED NOT DETECTED Final   Staphylococcus aureus (BCID) NOT DETECTED NOT DETECTED Final   Staphylococcus epidermidis NOT DETECTED NOT DETECTED Final   Staphylococcus lugdunensis NOT DETECTED NOT DETECTED Final   Streptococcus species NOT DETECTED NOT DETECTED Final   Streptococcus agalactiae NOT  DETECTED NOT DETECTED Final   Streptococcus pneumoniae NOT DETECTED NOT DETECTED Final   Streptococcus pyogenes NOT DETECTED NOT DETECTED Final   A.calcoaceticus-baumannii NOT DETECTED NOT DETECTED Final   Bacteroides fragilis NOT DETECTED NOT DETECTED Final   Enterobacterales DETECTED (A) NOT DETECTED Final    Comment: Enterobacterales represent a large order of gram negative bacteria, not a single organism. CRITICAL RESULT CALLED TO, READ BACK BY AND VERIFIED WITH: PHARMD M. LILLISTON 01/27/23 @ 0126 BY AB    Enterobacter cloacae complex NOT DETECTED NOT DETECTED Final   Escherichia coli DETECTED (A) NOT DETECTED Final    Comment: CRITICAL RESULT CALLED TO, READ BACK BY AND VERIFIED WITH: PHARMD M. LILLISTON 01/27/23 @ 0126 BY AB    Klebsiella aerogenes NOT DETECTED NOT DETECTED Final   Klebsiella oxytoca NOT DETECTED NOT DETECTED Final   Klebsiella pneumoniae NOT DETECTED NOT DETECTED Final   Proteus species NOT DETECTED NOT DETECTED Final   Salmonella species NOT DETECTED NOT DETECTED Final   Serratia marcescens NOT DETECTED NOT DETECTED Final   Haemophilus influenzae NOT DETECTED NOT DETECTED Final   Neisseria meningitidis NOT DETECTED NOT DETECTED Final   Pseudomonas aeruginosa NOT DETECTED NOT DETECTED Final   Stenotrophomonas maltophilia NOT DETECTED NOT DETECTED Final   Candida albicans NOT DETECTED NOT DETECTED Final   Candida auris NOT DETECTED NOT DETECTED Final   Candida glabrata NOT DETECTED NOT DETECTED Final   Candida krusei NOT DETECTED NOT DETECTED Final   Candida parapsilosis NOT DETECTED NOT DETECTED Final   Candida tropicalis NOT DETECTED NOT DETECTED Final   Cryptococcus neoformans/gattii NOT DETECTED NOT DETECTED Final   CTX-M ESBL DETECTED (A) NOT DETECTED Final    Comment: CRITICAL RESULT CALLED TO, READ BACK BY AND VERIFIED WITH: PHARMD M. LILLISTON 01/27/23 @ 0126 BY AB (NOTE) Extended spectrum beta-lactamase detected. Recommend a carbapenem as initial  therapy.      Carbapenem resistance IMP NOT DETECTED NOT DETECTED Final   Carbapenem resistance KPC NOT DETECTED NOT DETECTED Final   Carbapenem resistance NDM NOT DETECTED NOT DETECTED Final   Carbapenem resist OXA 48 LIKE NOT DETECTED NOT DETECTED Final   Carbapenem resistance VIM NOT DETECTED NOT DETECTED Final    Comment: Performed at [facility], [place] [address], [place], [place] [address]  Blood Culture (routine x 2)     Status: Abnormal   Collection Time: 01/26/23  3:59 AM   Specimen: BLOOD  Result Value Ref Range Status   Specimen Description   Final    BLOOD Performed at [facility], [address] [place], [place] [address]    Special Requests   Final    NONE Performed at [facility], [address] [place], [place] [address]    Culture  Setup Time   Final    GRAM NEGATIVE RODS AEROBIC BOTTLE ONLY CRITICAL VALUE NOTED.  VALUE IS CONSISTENT WITH PREVIOUSLY REPORTED AND CALLED VALUE. Performed at [facility], [place] [address] [place], [place] [address]    Culture (A)  Final    ESCHERICHIA COLI Confirmed Extended Spectrum Beta-Lactamase Producer (ESBL).  In bloodstream infections from ESBL organisms, carbapenems are preferred over piperacillin/tazobactam. They are shown to have a lower risk of mortality.    Report Status 01/29/2023 FINAL  Final   Organism ID, Bacteria ESCHERICHIA COLI  Final      Susceptibility   Escherichia coli - MIC*    AMPICILLIN >=32 RESISTANT Resistant     CEFEPIME 16 RESISTANT Resistant     CEFTAZIDIME RESISTANT Resistant     CEFTRIAXONE >=64 RESISTANT Resistant     CIPROFLOXACIN 0.5 INTERMEDIATE Intermediate     GENTAMICIN >=16 RESISTANT Resistant     IMIPENEM <=0.25 SENSITIVE Sensitive     TRIMETH/SULFA >=320 RESISTANT Resistant     AMPICILLIN/SULBACTAM >=32 RESISTANT Resistant     PIP/TAZO <=4 SENSITIVE Sensitive     * ESCHERICHIA COLI  MRSA Next Gen by PCR, Nasal     Status: None    Collection Time: 01/26/23 10:00 AM   Specimen: Nasal Mucosa; Nasal Swab  Result Value Ref Range Status   MRSA by PCR Next Gen NOT DETECTED NOT DETECTED Final    Comment: (NOTE) The GeneXpert MRSA Assay (FDA approved for NASAL specimens only), is one component of a comprehensive MRSA colonization surveillance program. It is not intended to diagnose MRSA infection nor to guide or monitor treatment for MRSA infections. Test performance is not FDA approved in patients less than 71 years old. Performed at [facility], [place] [address] [place], [place] [address]          Radiology Studies: DG CHEST PORT 1 VIEW  Result Date: 01/28/2023 CLINICAL DATA:  Hypoxia, shortness of breath, sepsis EXAM: PORTABLE CHEST 1 VIEW COMPARISON:  01/26/2023 FINDINGS: Mild cardiomegaly. Both lungs are clear. The visualized skeletal structures are unremarkable. IMPRESSION: Mild cardiomegaly without acute abnormality of the lungs in AP portable projection. Electronically Signed   By: Delanna Ahmadi M.D.   On: 01/28/2023 19:35        Scheduled Meds:  aspirin EC  81 mg Oral Daily   Chlorhexidine Gluconate Cloth  6 each Topical Daily   enoxaparin (LOVENOX) injection  40 mg Subcutaneous Daily   pantoprazole  40 mg Oral Daily   rosuvastatin  5 mg Oral Daily   Continuous Infusions:  meropenem (MERREM) IV 1 g (01/29/23 0913)     LOS: 3 days    Time spent: 40 minutes    Irine Seal, MD [facility]   To contact the attending provider between 7A-7P or the covering provider during after hours 7P-7A, please log into the web site [email or web address] and access using universal [place] password for that web site. If you do not have the password, please call the hospital operator.  01/29/2023, 10:33 AM

## 2023-01-30 ENCOUNTER — Other Ambulatory Visit: Payer: Self-pay

## 2023-01-30 DIAGNOSIS — A419 Sepsis, unspecified organism: Secondary | ICD-10-CM | POA: Diagnosis not present

## 2023-01-30 DIAGNOSIS — B9629 Other Escherichia coli [E. coli] as the cause of diseases classified elsewhere: Secondary | ICD-10-CM

## 2023-01-30 DIAGNOSIS — N390 Urinary tract infection, site not specified: Secondary | ICD-10-CM | POA: Diagnosis not present

## 2023-01-30 DIAGNOSIS — N12 Tubulo-interstitial nephritis, not specified as acute or chronic: Secondary | ICD-10-CM | POA: Diagnosis not present

## 2023-01-30 LAB — HEPATIC FUNCTION PANEL
ALT: 186 U/L — ABNORMAL HIGH (ref 0–44)
AST: 231 U/L — ABNORMAL HIGH (ref 15–41)
Albumin: 2.8 g/dL — ABNORMAL LOW (ref 3.5–5.0)
Alkaline Phosphatase: 201 U/L — ABNORMAL HIGH (ref 38–126)
Bilirubin, Direct: 0.2 mg/dL (ref 0.0–0.2)
Indirect Bilirubin: 0.8 mg/dL (ref 0.3–0.9)
Total Bilirubin: 1 mg/dL (ref 0.3–1.2)
Total Protein: 5.8 g/dL — ABNORMAL LOW (ref 6.5–8.1)

## 2023-01-30 LAB — CBC
HCT: 27.9 % — ABNORMAL LOW (ref 39.0–52.0)
Hemoglobin: 9.7 g/dL — ABNORMAL LOW (ref 13.0–17.0)
MCH: 32.8 pg (ref 26.0–34.0)
MCHC: 34.8 g/dL (ref 30.0–36.0)
MCV: 94.3 fL (ref 80.0–100.0)
Platelets: 197 10*3/uL (ref 150–400)
RBC: 2.96 MIL/uL — ABNORMAL LOW (ref 4.22–5.81)
RDW: 11.7 % (ref 11.5–15.5)
WBC: 5.5 10*3/uL (ref 4.0–10.5)
nRBC: 0 % (ref 0.0–0.2)

## 2023-01-30 LAB — BASIC METABOLIC PANEL
Anion gap: 12 (ref 5–15)
BUN: 12 mg/dL (ref 8–23)
CO2: 24 mmol/L (ref 22–32)
Calcium: 8.4 mg/dL — ABNORMAL LOW (ref 8.9–10.3)
Chloride: 97 mmol/L — ABNORMAL LOW (ref 98–111)
Creatinine, Ser: 0.61 mg/dL (ref 0.61–1.24)
GFR, Estimated: >60 mL/min (ref >60)
Glucose, Bld: 92 mg/dL (ref 70–99)
Potassium: 4.2 mmol/L (ref 3.5–5.1)
Sodium: 133 mmol/L — ABNORMAL LOW (ref 135–145)

## 2023-01-30 LAB — GLUCOSE, CAPILLARY: Glucose-Capillary: 100 mg/dL — ABNORMAL HIGH (ref 70–99)

## 2023-01-30 MED ORDER — ASPIRIN 81 MG PO TBEC: 81.0000 mg | DELAYED_RELEASE_TABLET | Freq: Every day | ORAL | 0 refills | Status: AC

## 2023-01-30 MED ORDER — SODIUM CHLORIDE 0.9% FLUSH: 10.0000 mL | Freq: Two times a day (BID) | INTRAVENOUS | Status: DC

## 2023-01-30 MED ORDER — SODIUM CHLORIDE 0.9% FLUSH: 10.0000 mL | INTRAVENOUS | Status: DC | PRN

## 2023-01-30 MED ORDER — ERTAPENEM IV (FOR PTA / DISCHARGE USE ONLY): 1.0000 g | INTRAVENOUS | 0 refills | Status: AC

## 2023-01-30 NOTE — Hospital Course (Signed)
72 year old gentleman history of arthritis, GERD, grade 1 prostate cancer diagnosed 11/05/2020 status post truce biopsy with progression to grade 1-3 10/2022 as well as LUTS despite use of twice daily Flomax. Patient noted in the interim to have a prostatectomy and node dissection done in January 07 2023, and did well postoperatively. Patient presented to [place] 01/26/2023 with shortness of breath in the setting of high fevers and rigors. Patient denies any cough chest pain upper respiratory symptoms. Patient denies any dysuria diarrhea abdominal pain. Patient noted on presentation to have a temp of 101.2, BP 115/68 with a heart rate of 105 respiratory rate 22. CT abdomen and pelvis done showed bilateral striated nephrograms concerning for acute pyelonephritis, status post prostatectomy. Patient admitted, pancultured placed empirically on IV antibiotics. Blood cultures came back positive for ESBL E. coli bacteremia, urine cultures positive for E. coli. Patient initially was on IV cefepime and transition to IV meropenem. Patient also noted to be in volume overload, and concern for elevated troponin/non-STEMI seen by cardiology, diuresed. ID also consulted.

## 2023-01-30 NOTE — Discharge Summary (Signed)
Physician Discharge Summary   Patient: Trevor Wilson. MRN: [id number] DOB: [birth year]  Admit date:     01/26/2023  Discharge date: 01/30/23  Discharge Physician: Marylu Lund   PCP: Lujean Amel, MD   Recommendations at discharge:    Follow up with PCP in 1-2 weeks Follow up with Cardiology as scheduled Follow up for outpatient coronary CTA with Dr. Johney Frame per Cardiology  Discharge Diagnoses: Principal Problem:   Sepsis secondary to UTI [facility] Active Problems:   Pyelonephritis   NSTEMI (non-ST elevated myocardial infarction) ([place])   Hypervolemia   Infection due to ESBL-producing Escherichia coli   UTI due to extended-spectrum beta lactamase (ESBL) producing Escherichia coli   Gastroesophageal reflux disease   Hyponatremia   Normocytic anemia  Resolved Problems:   * No resolved hospital problems. *  Hospital Course: 72 year old gentleman history of arthritis, GERD, grade 1 prostate cancer diagnosed 11/05/2020 status post truce biopsy with progression to grade 1-3 10/2022 as well as LUTS despite use of twice daily Flomax. Patient noted in the interim to have a prostatectomy and node dissection done in January 07 2023, and did well postoperatively. Patient presented to [facility] 01/26/2023 with shortness of breath in the setting of high fevers and rigors. Patient denies any cough chest pain upper respiratory symptoms. Patient denies any dysuria diarrhea abdominal pain. Patient noted on presentation to have a temp of 101.2, BP 115/68 with a heart rate of 105 respiratory rate 22. CT abdomen and pelvis done showed bilateral striated nephrograms concerning for acute pyelonephritis, status post prostatectomy. Patient admitted, pancultured placed empirically on IV antibiotics. Blood cultures came back positive for ESBL E. coli bacteremia, urine cultures positive for E. coli. Patient initially was on IV cefepime and transition to IV meropenem. Patient also noted to be in  volume overload, and concern for elevated troponin/non-STEMI seen by cardiology, diuresed. ID also consulted.   Assessment and Plan: #1 severe sepsis secondary to pyelonephritis and ESBL E. coli bacteremia -Patient admitted with rigors, high fevers, shortness of breath in the setting of recent prostatectomy 01/07/2023. -CT abdomen and pelvis done concerning for bilateral acute pyelonephritis. -Urinalysis done concerning for UTI. -Blood cultures now positive for ESBL E. coli bacteremia, urine cultures with ESBL E. coli. -Patient with a leukocytosis on admission which has trended down. -Fever curve trending down. -Patient improving clinically. -Was initially on IV vancomycin and IV cefepime and IV antibiotics changed to IV meropenem. -Due to ESBL bacteremia and ESBL E. coli in the urine, ID consulted for further evaluation, recommendations and management. -ID recommending patient will need total 10 day course of abx, to be discharged on ertapenem to complete on 02/05/23 -PICC placed 3/15   2.  Volume overload -Felt likely secondary to sepsis in the setting of aggressive fluid resuscitation. -EKG done with no ischemic changes noted. -CT angiogram chest done negative for PE. -2D echo done with EF of 55 to [id number], grade 1 diastolic dysfunction.  Normal right ventricular systolic function.  Moderately elevated pulmonary artery systolic pressure.  Mildly dilated left atrial size.  Mildly dilated right atrial size.  Grade 2 atheroma plaque involving the ascending aorta. -Patient received IV Lasix prior cardiology recommendations and patient with good urine output. -Patient improved clinically -No further recommendations per cardiology   3.  Non-STEMI/elevated troponins -Felt likely secondary to demand ischemia secondary to problem #1. -CT angiogram chest negative for PE. --2D echo done with EF of 55 to [id number], grade 1 diastolic dysfunction.  Normal right ventricular systolic  function.   Moderately elevated pulmonary artery systolic pressure.  Mildly dilated left atrial size.  Mildly dilated right atrial size.  Grade 2 atheroma plaque involving the ascending aorta. -Cardiology recommending to complete 48 hours of heparin and to continue aspirin and statin. -Discontinued heparin. -Patient currently denies any chest pain. -Cardiology recommending outpatient coronary CTA with Dr. Johney Frame. -Outpatient follow-up. -Appreciate cardiology input and recommendations.   4.  Status post prostatectomy 01/07/2023 -CT abdomen and pelvis done with concern for bilateral acute pyelonephritis status post prostatectomy.  No abnormal fluid collections identified within the prostate bed.  Small volume of free fluid identified within the pelvis. -Patient being treated with IV antibiotics for pyelonephritis. -Outpatient follow-up with urology.   5.  Transaminitis/elevated alk phosphatase -Likely in the setting of volume overload and sepsis. -Acute hepatitis panel neg -right upper quadrant ultrasound reviewed, neg   6.  Hyponatremia -In the setting of diuresis. -Improving.   7.-Hyperlipidemia -Statin initially held and recommended to be resumed per cardiology. -Continue Crestor.   8.  Normocytic anemia -Anemia panel done with iron level of 9, TIBC of 192, ferritin of 366, folate of 22.6, vitamin B12 of 1018. -Hemoglobin stable    9.  Overweight -BMI approximately 25.08 kg/g/m. -Lifestyle modification -Outpatient follow-up with PCP.   10.  GERD -Continue PPI.   11.  Transient hypoxia/0.9-second pause -Patient noted to have a transient hypoxia yesterday which has subsequently since resolved. -Patient noted on telemetry to have an approximately dropped beat/0.9-second pause. -Not on AV nodal blocking agents. -Cardiology had been following this visit      Consultants: ID, Cardiology Procedures performed: PICC placed 3/15  Disposition: Home Diet recommendation:  Cardiac  diet DISCHARGE MEDICATION: Allergies as of 01/30/2023   No Known Allergies      Medication List     STOP taking these medications    docusate sodium 100 MG capsule Commonly known as: COLACE   HYDROcodone-acetaminophen 5-325 MG tablet Commonly known as: Norco       TAKE these medications    acetaminophen 325 MG tablet Commonly known as: TYLENOL Take 650 mg by mouth every 6 (six) hours as needed for moderate pain.   aspirin EC 81 MG tablet Take 1 tablet (81 mg total) by mouth daily. Swallow whole. Start taking on: January 31, 2023   diclofenac 75 MG EC tablet Commonly known as: VOLTAREN Take 75 mg by mouth daily.   ertapenem  IVPB Commonly known as: INVANZ Inject 1 g into the vein daily for 7 days. Indication:  ESBL bacteremia/pyelo First Dose: Yes Last Day of Therapy:  02/05/23 Labs - Once weekly:  CBC/D and BMP, Labs - Every other week:  ESR and CRP Method of administration: Mini-Bag Plus / Gravity Pull midline at the completion of IV therapy Method of administration may be changed at the discretion of home infusion pharmacist based upon assessment of the patient and/or caregiver's ability to self-administer the medication ordered.   MULTIVITAMIN PO Take 1 tablet by mouth daily.   omeprazole 40 MG capsule Commonly known as: PRILOSEC Take 40 mg by mouth every morning.   rosuvastatin 5 MG tablet Commonly known as: CRESTOR Take 5 mg by mouth daily.               Discharge Care Instructions  (From admission, onward)           Start     Ordered   01/30/23 0000  Change dressing on IV access line weekly and PRN  (Home  infusion instructions - [facility] )        01/30/23 1159            Follow-up Information     [facility] Follow up.   Specialty: [place] Why: A representative with [facility] will contact you at discharge regarding your home health RN services. Contact information: [place] [address] [phone]         [facility] Follow up.   Why: A representative with [facility] will contact you regarding your home IV antibiotics services.        Koirala, Dibas, MD Follow up in 2 week(s).   Specialty: Family Medicine Why: Hospital follow up Contact information: [address] [place] [address] [phone]         Freada Bergeron, MD Follow up.   Specialties: Cardiology, Radiology Why: as scheduled, Hospital follow up Contact information: [id number] [place] [address] [phone]         Rosiland Oz, MD Follow up in 2 week(s).   Specialty: Infectious Diseases Why: Hospital follow up Contact information: [address] [place] [address] [phone]                Discharge Exam: Danley Danker Weights   01/27/23 0326 01/28/23 0500 01/29/23 1000  Weight: 79.3 kg 77.4 kg 76 kg   General exam: Awake, laying in bed, in nad Respiratory system: Normal respiratory effort, no wheezing Cardiovascular system: regular rate, s1, s2 Gastrointestinal system: Soft, nondistended, positive BS Central nervous system: CN2-12 grossly intact, strength intact Extremities: Perfused, no clubbing Skin: Normal skin turgor, no notable skin lesions seen Psychiatry: Mood normal // no visual hallucinations   Condition at discharge: fair  The results of significant diagnostics from this hospitalization (including imaging, microbiology, ancillary and laboratory) are listed below for reference.   Imaging Studies: [place] EKG SITE RITE  Result Date: 01/30/2023 If Site Rite image not attached, placement could not be confirmed due to current cardiac rhythm.  US Abdomen Limited RUQ (LIVER/GB)  Result Date: 01/29/2023 CLINICAL DATA:  Abnormal liver function tests EXAM: ULTRASOUND ABDOMEN LIMITED RIGHT UPPER QUADRANT COMPARISON:  None Available. FINDINGS: Gallbladder: No gallstones or wall  thickening visualized. No sonographic Murphy sign noted by sonographer. Common bile duct: Diameter: 3.8 mm Liver: No focal lesion identified. Within normal limits in parenchymal echogenicity. Portal vein is patent on color Doppler imaging with normal direction of blood flow towards the liver. Other: None. IMPRESSION: No sonographic abnormalities are seen in right upper quadrant of abdomen. Electronically Signed   By: Elmer Picker M.D.   On: 01/29/2023 12:03   DG CHEST PORT 1 VIEW  Result Date: 01/28/2023 CLINICAL DATA:  Hypoxia, shortness of breath, sepsis EXAM: PORTABLE CHEST 1 VIEW COMPARISON:  01/26/2023 FINDINGS: Mild cardiomegaly. Both lungs are clear. The visualized skeletal structures are unremarkable. IMPRESSION: Mild cardiomegaly without acute abnormality of the lungs in AP portable projection. Electronically Signed   By: Delanna Ahmadi M.D.   On: 01/28/2023 19:35   ECHOCARDIOGRAM COMPLETE  Result Date: 01/27/2023    ECHOCARDIOGRAM REPORT   Patient Name:   Ilai Wark. Date of Exam: 01/27/2023 Medical Rec #:  [id number]               Height:       70.0 in Accession #:    [id number]  Weight:       174.8 lb Date of Birth:  1951/08/23               BSA:          1.971 m Patient Age:    36 years                BP:           125/59 mmHg Patient Gender: M                       HR:           87 bpm. Exam Location:  Inpatient Procedure: 2D Echo, 3D Echo, Color Doppler and Cardiac Doppler Indications:     dyspnea  History:         Patient has no prior history of Echocardiogram examinations.  Sonographer:     Harvie Junior Referring Phys:  [id number] [place] Diagnosing Phys: Dixie Dials MD IMPRESSIONS  1. Left ventricular ejection fraction, by estimation, is 55 to 60%. The left ventricle has normal function. The left ventricle has no regional wall motion abnormalities. Left ventricular diastolic parameters are consistent with Grade I diastolic dysfunction (impaired  relaxation).  2. Right ventricular systolic function is normal. The right ventricular size is normal. There is moderately elevated pulmonary artery systolic pressure.  3. Left atrial size was mildly dilated.  4. Right atrial size was mildly dilated.  5. The mitral valve is normal in structure. Mild mitral valve regurgitation.  6. The aortic valve is normal in structure. Aortic valve regurgitation is not visualized.  7. There is mild (Grade II) atheroma plaque involving the ascending aorta.  8. The inferior vena cava is normal in size with greater than 50% respiratory variability, suggesting right atrial pressure of 3 mmHg. FINDINGS  Left Ventricle: Left ventricular ejection fraction, by estimation, is 55 to 60%. The left ventricle has normal function. The left ventricle has no regional wall motion abnormalities. The left ventricular internal cavity size was normal in size. There is  no left ventricular hypertrophy. Left ventricular diastolic parameters are consistent with Grade I diastolic dysfunction (impaired relaxation). Right Ventricle: The right ventricular size is normal. No increase in right ventricular wall thickness. Right ventricular systolic function is normal. There is moderately elevated pulmonary artery systolic pressure. The tricuspid regurgitant velocity is 3.49 m/s, and with an assumed right atrial pressure of 3 mmHg, the estimated right ventricular systolic pressure is [id number] mmHg. Left Atrium: Left atrial size was mildly dilated. Right Atrium: Right atrial size was mildly dilated. Pericardium: There is no evidence of pericardial effusion. Mitral Valve: The mitral valve is normal in structure. Mild mitral valve regurgitation. Tricuspid Valve: The tricuspid valve is normal in structure. Tricuspid valve regurgitation is mild. Aortic Valve: The aortic valve is normal in structure. Aortic valve regurgitation is not visualized. Aortic valve mean gradient measures 3.0 mmHg. Aortic valve peak gradient  measures 5.6 mmHg. Aortic valve area, by VTI measures 3.05 cm. Pulmonic Valve: The pulmonic valve was normal in structure. Pulmonic valve regurgitation is not visualized. Aorta: The aortic root is normal in size and structure. There is mild (Grade II) atheroma plaque involving the ascending aorta. Venous: The inferior vena cava is normal in size with greater than 50% respiratory variability, suggesting right atrial pressure of 3 mmHg. IAS/Shunts: The atrial septum is grossly normal.  LEFT VENTRICLE PLAX 2D LVIDd:         5.40 cm  Diastology LVIDs:         3.50 cm      LV e' medial:    13.20 cm/s LV PW:         1.00 cm      LV E/e' medial:  5.2 LV IVS:        1.00 cm      LV e' lateral:   16.50 cm/s LVOT diam:     2.20 cm      LV E/e' lateral: 4.2 LV SV:         59 LV SV Index:   30 LVOT Area:     3.80 cm                              3D Volume EF: LV Volumes (MOD)            3D EF:        63 % LV vol d, MOD A2C: 123.0 ml LV EDV:       154 ml LV vol d, MOD A4C: 107.0 ml LV ESV:       57 ml LV vol s, MOD A2C: 44.0 ml  LV SV:        96 ml LV vol s, MOD A4C: 42.7 ml LV SV MOD A2C:     79.0 ml LV SV MOD A4C:     107.0 ml LV SV MOD BP:      73.3 ml RIGHT VENTRICLE RV Basal diam:  4.10 cm RV Mid diam:    3.30 cm RV S prime:     14.90 cm/s TAPSE (M-mode): 2.2 cm LEFT ATRIUM           Index        RIGHT ATRIUM           Index LA diam:      3.30 cm 1.67 cm/m   RA Area:     15.00 cm LA Vol (A4C): 57.4 ml 29.12 ml/m  RA Volume:   40.20 ml  20.39 ml/m  AORTIC VALVE                    PULMONIC VALVE AV Area (Vmax):    3.10 cm     PV Vmax:          1.03 m/s AV Area (Vmean):   3.10 cm     PV Peak grad:     4.2 mmHg AV Area (VTI):     3.05 cm     PR End Diast Vel: 5.86 msec AV Vmax:           118.00 cm/s AV Vmean:          78.200 cm/s AV VTI:            0.192 m AV Peak Grad:      5.6 mmHg AV Mean Grad:      3.0 mmHg LVOT Vmax:         96.30 cm/s LVOT Vmean:        63.800 cm/s LVOT VTI:          0.154 m LVOT/AV VTI ratio:  0.80  AORTA Ao Root diam: 3.70 cm Ao Asc diam:  3.10 cm MITRAL VALVE               TRICUSPID VALVE MV Area (PHT): 4.06 cm    TR Peak grad:   48.7 mmHg MV Decel Time:  187 msec    TR Vmax:        349.00 cm/s MR Peak grad: 49.6 mmHg MR Vmax:      352.00 cm/s  SHUNTS MV E velocity: 68.60 cm/s  Systemic VTI:  0.15 m MV A velocity: 77.50 cm/s  Systemic Diam: 2.20 cm MV E/A ratio:  0.89 Dixie Dials MD Electronically signed by Dixie Dials MD Signature Date/Time: 01/27/2023/5:18:56 PM    Final    CT Angio Chest Pulmonary Embolism (PE) W or WO Contrast  Result Date: 01/26/2023 CLINICAL DATA:  High probability for PE. Status post prostatectomy. Shortness of breath and fever. EXAM: CT ANGIOGRAPHY CHEST WITH CONTRAST TECHNIQUE: Multidetector CT imaging of the chest was performed using the standard protocol during bolus administration of intravenous contrast. Multiplanar CT image reconstructions and MIPs were obtained to evaluate the vascular anatomy. RADIATION DOSE REDUCTION: This exam was performed according to the departmental dose-optimization program which includes automated exposure control, adjustment of the mA and/or kV according to patient size and/or use of iterative reconstruction technique. CONTRAST:  3mL OMNIPAQUE IOHEXOL 350 MG/ML SOLN COMPARISON:  CT abdomen and pelvis same day FINDINGS: Cardiovascular: Satisfactory opacification of the pulmonary arteries to the segmental level. No evidence of pulmonary embolism. Normal heart size. No pericardial effusion. There are atherosclerotic calcifications of the aorta. Mediastinum/Nodes: There are enlarged paratracheal lymph nodes measuring up to 10 mm short axis. Enlarged subcarinal lymph node measures 12 mm. Enlarged right hilar lymph node measures 15 mm. Enlarged left hilar lymph node measures 12 mm. Esophagus is within normal limits. There is a small hiatal hernia. Visualized thyroid gland is within normal limits. Lungs/Pleura: There are trace bilateral pleural  effusions. There some scattered patchy ground-glass opacities in both mid and lower lungs with some smooth interlobular septal thickening in the lung bases. Trachea and central airways are patent. No pneumothorax. Upper Abdomen: No acute abnormality. Musculoskeletal: There is mild compression deformity of the superior endplate of T7 which is age indeterminate. Review of the MIP images confirms the above findings. IMPRESSION: 1. No evidence for pulmonary embolism. 2. Trace bilateral pleural effusions. 3. Patchy ground-glass opacities in the mid and lower lungs with smooth interlobular septal thickening in the lung bases worrisome for pulmonary edema. 4. Mediastinal and bilateral hilar lymphadenopathy of uncertain etiology. 5. Age-indeterminate T7 compression deformity. Aortic Atherosclerosis (ICD10-I70.0). Electronically Signed   By: Ronney Asters M.D.   On: 01/26/2023 21:33   CT Abdomen Pelvis W Contrast  Result Date: 01/26/2023 CLINICAL DATA:  Pain and fever after prostatectomy. EXAM: CT ABDOMEN AND PELVIS WITH CONTRAST TECHNIQUE: Multidetector CT imaging of the abdomen and pelvis was performed using the standard protocol following bolus administration of intravenous contrast. RADIATION DOSE REDUCTION: This exam was performed according to the departmental dose-optimization program which includes automated exposure control, adjustment of the mA and/or kV according to patient size and/or use of iterative reconstruction technique. CONTRAST:  181mL OMNIPAQUE IOHEXOL 300 MG/ML  SOLN COMPARISON:  None Available. FINDINGS: Lower chest: No acute abnormality. Hepatobiliary: No focal liver abnormality is seen. No gallstones, gallbladder wall thickening, or biliary dilatation. Pancreas: Unremarkable. No pancreatic ductal dilatation or surrounding inflammatory changes. Spleen: Normal in size without focal abnormality. Adrenals/Urinary Tract: Normal adrenal glands. Bosniak class 1 and 2 kidney cysts identified. The largest  arises off the upper pole of the left kidney measuring 1.4 cm. No follow-up imaging recommended. Bilateral striated nephrograms identified concerning for pyelonephritis. No nephrolithiasis or hydronephrosis. Urinary bladder is unremarkable. Stomach/Bowel: Small hiatal hernia. The appendix is  visualized and is within normal limits. Sigmoid diverticulosis without signs of acute diverticulitis. No bowel wall thickening, inflammation or distension. Vascular/Lymphatic: Aortic atherosclerosis. No abdominopelvic adenopathy. Reproductive: Status post prostatectomy. No abnormal fluid collections identified within the prostate bed. Other: Small volume of free fluid is identified within the pelvis. No focal fluid collections. Musculoskeletal: No acute or significant osseous findings. IMPRESSION: 1. Bilateral striated nephrograms concerning for acute pyelonephritis. 2. Status post prostatectomy. No abnormal fluid collections identified within the prostate bed. 3. Small volume of free fluid is identified within the pelvis. Nonspecific in the early postoperative time frame. 4. Small hiatal hernia. 5.  Aortic Atherosclerosis (ICD10-I70.0). Electronically Signed   By: Kerby Moors M.D.   On: 01/26/2023 05:14   DG Chest Port 1 View  Result Date: 01/26/2023 CLINICAL DATA:  72 year old male with possible sepsis. Shortness of breath. EXAM: PORTABLE CHEST 1 VIEW COMPARISON:  None Available. FINDINGS: Portable AP view at 0403 hours. Normal lung volumes and mediastinal contours. Visualized tracheal air column is within normal limits. Allowing for portable technique the lungs are clear. No pneumothorax or pleural effusion. Paucity of bowel gas in the visible abdomen. No acute osseous abnormality identified. IMPRESSION: Negative portable chest. Electronically Signed   By: Genevie Ann M.D.   On: 01/26/2023 04:15    Microbiology: Results for orders placed or performed during the hospital encounter of 01/26/23  Blood Culture (routine x  2)     Status: Abnormal   Collection Time: 01/26/23  3:48 AM   Specimen: BLOOD  Result Value Ref Range Status   Specimen Description   Final    BLOOD Performed at [facility], [address], [place], [place] [address]    Special Requests   Final    NONE Performed at [facility], [address], [place], [place] [address]    Culture  Setup Time   Final    GRAM NEGATIVE RODS IN BOTH AEROBIC AND ANAEROBIC BOTTLES CRITICAL RESULT CALLED TO, READ BACK BY AND VERIFIED WITH: [place] 01/27/23 @ 0126 BY AB    Culture (A)  Final    ESCHERICHIA COLI SUSCEPTIBILITIES PERFORMED ON PREVIOUS CULTURE WITHIN THE LAST 5 DAYS. Performed at [facility], [place] [address], [place], [place] [address]    Report Status 01/29/2023 FINAL  Final  Urine Culture     Status: Abnormal   Collection Time: 01/26/23  3:48 AM   Specimen: Urine, Clean Catch  Result Value Ref Range Status   Specimen Description   Final    URINE, CLEAN CATCH Performed at [facility], [address] [place], [place] [address]    Special Requests   Final    NONE Performed at [facility], [address], [place], [place] [address]    Culture (A)  Final    50,000 COLONIES/mL ESCHERICHIA COLI Confirmed Extended Spectrum Beta-Lactamase Producer (ESBL).  In bloodstream infections from ESBL organisms, carbapenems are preferred over piperacillin/tazobactam. They are shown to have a lower risk of mortality.    Report Status 01/28/2023 FINAL  Final   Organism ID, Bacteria ESCHERICHIA COLI (A)  Final      Susceptibility   Escherichia coli - MIC*    AMPICILLIN >=32 RESISTANT Resistant     CEFAZOLIN >=64 RESISTANT Resistant     CEFEPIME 16 RESISTANT Resistant     CEFTRIAXONE >=64 RESISTANT Resistant     CIPROFLOXACIN <=0.25 SENSITIVE Sensitive     GENTAMICIN >=16 RESISTANT Resistant  IMIPENEM <=0.25 SENSITIVE Sensitive      NITROFURANTOIN <=16 SENSITIVE Sensitive     TRIMETH/SULFA >=320 RESISTANT Resistant     AMPICILLIN/SULBACTAM >=32 RESISTANT Resistant     PIP/TAZO <=4 SENSITIVE Sensitive     * 50,000 COLONIES/mL ESCHERICHIA COLI  Resp panel by RT-PCR (RSV, Flu A&B, Covid) Anterior Nasal Swab     Status: None   Collection Time: 01/26/23  3:48 AM   Specimen: Anterior Nasal Swab  Result Value Ref Range Status   SARS Coronavirus 2 by RT PCR NEGATIVE NEGATIVE Final    Comment: (NOTE) SARS-CoV-2 target nucleic acids are NOT DETECTED.  The SARS-CoV-2 RNA is generally detectable in upper respiratory specimens during the acute phase of infection. The lowest concentration of SARS-CoV-2 viral copies this assay can detect is 138 copies/mL. A negative result does not preclude SARS-Cov-2 infection and should not be used as the sole basis for treatment or other patient management decisions. A negative result may occur with  improper specimen collection/handling, submission of specimen other than nasopharyngeal swab, presence of viral mutation(s) within the areas targeted by this assay, and inadequate number of viral copies(<138 copies/mL). A negative result must be combined with clinical observations, patient history, and epidemiological information. The expected result is Negative.  Fact Sheet for Patients:  EntrepreneurPulse.com.au  Fact Sheet for Healthcare Providers:  IncredibleEmployment.be  This test is no t yet approved or cleared by the [place] FDA and  has been authorized for detection and/or diagnosis of SARS-CoV-2 by FDA under an Emergency Use Authorization (EUA). This EUA will remain  in effect (meaning this test can be used) for the duration of the COVID-19 declaration under Section 564(b)(1) of the Act, 21 U.S.C.section 360bbb-3(b)(1), unless the authorization is terminated  or revoked sooner.       Influenza A by PCR NEGATIVE NEGATIVE Final    Influenza B by PCR NEGATIVE NEGATIVE Final    Comment: (NOTE) The Xpert Xpress SARS-CoV-2/FLU/RSV plus assay is intended as an aid in the diagnosis of influenza from Nasopharyngeal swab specimens and should not be used as a sole basis for treatment. Nasal washings and aspirates are unacceptable for Xpert Xpress SARS-CoV-2/FLU/RSV testing.  Fact Sheet for Patients: EntrepreneurPulse.com.au  Fact Sheet for Healthcare Providers: IncredibleEmployment.be  This test is not yet approved or cleared by the [place] FDA and has been authorized for detection and/or diagnosis of SARS-CoV-2 by FDA under an Emergency Use Authorization (EUA). This EUA will remain in effect (meaning this test can be used) for the duration of the COVID-19 declaration under Section 564(b)(1) of the Act, 21 U.S.C. section 360bbb-3(b)(1), unless the authorization is terminated or revoked.     Resp Syncytial Virus by PCR NEGATIVE NEGATIVE Final    Comment: (NOTE) Fact Sheet for Patients: EntrepreneurPulse.com.au  Fact Sheet for Healthcare Providers: IncredibleEmployment.be  This test is not yet approved or cleared by the [place] FDA and has been authorized for detection and/or diagnosis of SARS-CoV-2 by FDA under an Emergency Use Authorization (EUA). This EUA will remain in effect (meaning this test can be used) for the duration of the COVID-19 declaration under Section 564(b)(1) of the Act, 21 U.S.C. section 360bbb-3(b)(1), unless the authorization is terminated or revoked.  Performed at [facility], [address], [place], [place] [address]   Blood Culture ID Panel (Reflexed)     Status: Abnormal   Collection Time: 01/26/23  3:48 AM  Result Value Ref Range Status   Enterococcus faecalis NOT DETECTED NOT DETECTED Final  Enterococcus Faecium NOT DETECTED NOT DETECTED Final   Listeria monocytogenes NOT  DETECTED NOT DETECTED Final   Staphylococcus species NOT DETECTED NOT DETECTED Final   Staphylococcus aureus (BCID) NOT DETECTED NOT DETECTED Final   Staphylococcus epidermidis NOT DETECTED NOT DETECTED Final   Staphylococcus lugdunensis NOT DETECTED NOT DETECTED Final   Streptococcus species NOT DETECTED NOT DETECTED Final   Streptococcus agalactiae NOT DETECTED NOT DETECTED Final   Streptococcus pneumoniae NOT DETECTED NOT DETECTED Final   Streptococcus pyogenes NOT DETECTED NOT DETECTED Final   A.calcoaceticus-baumannii NOT DETECTED NOT DETECTED Final   Bacteroides fragilis NOT DETECTED NOT DETECTED Final   Enterobacterales DETECTED (A) NOT DETECTED Final    Comment: Enterobacterales represent a large order of gram negative bacteria, not a single organism. CRITICAL RESULT CALLED TO, READ BACK BY AND VERIFIED WITH: PHARMD M. LILLISTON 01/27/23 @ 0126 BY AB    Enterobacter cloacae complex NOT DETECTED NOT DETECTED Final   Escherichia coli DETECTED (A) NOT DETECTED Final    Comment: CRITICAL RESULT CALLED TO, READ BACK BY AND VERIFIED WITH: PHARMD M. LILLISTON 01/27/23 @ 0126 BY AB    Klebsiella aerogenes NOT DETECTED NOT DETECTED Final   Klebsiella oxytoca NOT DETECTED NOT DETECTED Final   Klebsiella pneumoniae NOT DETECTED NOT DETECTED Final   Proteus species NOT DETECTED NOT DETECTED Final   Salmonella species NOT DETECTED NOT DETECTED Final   Serratia marcescens NOT DETECTED NOT DETECTED Final   Haemophilus influenzae NOT DETECTED NOT DETECTED Final   Neisseria meningitidis NOT DETECTED NOT DETECTED Final   Pseudomonas aeruginosa NOT DETECTED NOT DETECTED Final   Stenotrophomonas maltophilia NOT DETECTED NOT DETECTED Final   Candida albicans NOT DETECTED NOT DETECTED Final   Candida auris NOT DETECTED NOT DETECTED Final   Candida glabrata NOT DETECTED NOT DETECTED Final   Candida krusei NOT DETECTED NOT DETECTED Final   Candida parapsilosis NOT DETECTED NOT DETECTED Final    Candida tropicalis NOT DETECTED NOT DETECTED Final   Cryptococcus neoformans/gattii NOT DETECTED NOT DETECTED Final   CTX-M ESBL DETECTED (A) NOT DETECTED Final    Comment: CRITICAL RESULT CALLED TO, READ BACK BY AND VERIFIED WITH: PHARMD M. LILLISTON 01/27/23 @ 0126 BY AB (NOTE) Extended spectrum beta-lactamase detected. Recommend a carbapenem as initial therapy.      Carbapenem resistance IMP NOT DETECTED NOT DETECTED Final   Carbapenem resistance KPC NOT DETECTED NOT DETECTED Final   Carbapenem resistance NDM NOT DETECTED NOT DETECTED Final   Carbapenem resist OXA 48 LIKE NOT DETECTED NOT DETECTED Final   Carbapenem resistance VIM NOT DETECTED NOT DETECTED Final    Comment: Performed at [facility], [place] [address], [place], [place] [address]  Blood Culture (routine x 2)     Status: Abnormal   Collection Time: 01/26/23  3:59 AM   Specimen: BLOOD  Result Value Ref Range Status   Specimen Description   Final    BLOOD Performed at [facility], [address] [place], [place] [address]    Special Requests   Final    NONE Performed at [facility], [address], [place], [place] [address]    Culture  Setup Time   Final    GRAM NEGATIVE RODS AEROBIC BOTTLE ONLY CRITICAL VALUE NOTED.  VALUE IS CONSISTENT WITH PREVIOUSLY REPORTED AND CALLED VALUE. Performed at [facility], [place] [address] [place], [place] [address]    Culture (A)  Final    ESCHERICHIA COLI Confirmed Extended Spectrum Beta-Lactamase Producer (  ESBL).  In bloodstream infections from ESBL organisms, carbapenems are preferred over piperacillin/tazobactam. They are shown to have a lower risk of mortality.    Report Status 01/29/2023 FINAL  Final   Organism ID, Bacteria ESCHERICHIA COLI  Final      Susceptibility   Escherichia coli - MIC*    AMPICILLIN >=32 RESISTANT Resistant     CEFEPIME 16 RESISTANT Resistant     CEFTAZIDIME RESISTANT Resistant      CEFTRIAXONE >=64 RESISTANT Resistant     CIPROFLOXACIN 0.5 INTERMEDIATE Intermediate     GENTAMICIN >=16 RESISTANT Resistant     IMIPENEM <=0.25 SENSITIVE Sensitive     TRIMETH/SULFA >=320 RESISTANT Resistant     AMPICILLIN/SULBACTAM >=32 RESISTANT Resistant     PIP/TAZO <=4 SENSITIVE Sensitive     * ESCHERICHIA COLI  MRSA Next Gen by PCR, Nasal     Status: None   Collection Time: 01/26/23 10:00 AM   Specimen: Nasal Mucosa; Nasal Swab  Result Value Ref Range Status   MRSA by PCR Next Gen NOT DETECTED NOT DETECTED Final    Comment: (NOTE) The GeneXpert MRSA Assay (FDA approved for NASAL specimens only), is one component of a comprehensive MRSA colonization surveillance program. It is not intended to diagnose MRSA infection nor to guide or monitor treatment for MRSA infections. Test performance is not FDA approved in patients less than 79 years old. Performed at [facility], [place] [address] [place], [place] [address]     Labs: CBC: Recent Labs  Lab 01/26/23 0348 01/26/23 1051 01/27/23 0044 01/28/23 0311 01/29/23 0310 01/30/23 0521  WBC 4.3 12.9* 7.2 5.1 4.5 5.5  NEUTROABS 3.8 12.2*  --  3.7  --   --   HGB 10.3* 9.4* 8.3* 8.9* 8.9* 9.7*  HCT 29.2* 27.5* 25.1* 26.0* 25.7* 27.9*  MCV 93.6 96.5 98.8 95.9 95.9 94.3  PLT 189 159 137* 136* 166 [id number]   Basic Metabolic Panel: Recent Labs  Lab 01/26/23 1051 01/27/23 0044 01/28/23 0311 01/29/23 0310 01/30/23 0521  NA 131* 130* 128* 131* 133*  K 3.8 4.0 3.5 3.7 4.2  CL 97* 100 97* 99 97*  CO2 22 23 23 22 24   GLUCOSE 119* 108* 107* 96 92  BUN 16 14 12 10 12   CREATININE 0.75 0.79 0.80 0.44* 0.61  CALCIUM 8.1* 7.7* 7.8* 8.0* 8.4*  MG  --   --  1.9 1.9  --   PHOS  --   --  3.2  --   --    Liver Function Tests: Recent Labs  Lab 01/26/23 1051 01/27/23 0044 01/28/23 0311 01/29/23 0310 01/30/23 0517  AST 83* 69* 49* 61* 231*  ALT 109* 88* 68* 66* 186*  ALKPHOS 175* 164* 161* 193* 201*  BILITOT 1.1  0.8 1.0 0.9 1.0  PROT 6.0* 5.3* 5.5* 5.4* 5.8*  ALBUMIN 2.9* 2.6* 2.7* 2.6* 2.8*   CBG: Recent Labs  Lab 01/27/23 0824 01/28/23 0747 01/29/23 0842 01/30/23 0820  GLUCAP 91 98 89 100*    Discharge time spent: less than 30 minutes.  Signed: Marylu Lund, MD [facility] 01/30/2023

## 2023-01-30 NOTE — Progress Notes (Signed)
Peripherally Inserted Central Catheter Placement  The IV Nurse has discussed with the patient and/or persons authorized to consent for the patient, the purpose of this procedure and the potential benefits and risks involved with this procedure.  The benefits include less needle sticks, lab draws from the catheter, and the patient may be discharged home with the catheter. Risks include, but not limited to, infection, bleeding, blood clot (thrombus formation), and puncture of an artery; nerve damage and irregular heartbeat and possibility to perform a PICC exchange if needed/ordered by physician.  Alternatives to this procedure were also discussed.  Bard Power PICC patient education guide, fact sheet on infection prevention and patient information card has been provided to patient /or left at bedside.    PICC Placement Documentation  PICC Single Lumen [id number] Right Basilic 40 cm 0 cm (Active)  Indication for Insertion or Continuance of Line Home intravenous therapies (PICC only) 01/30/23 1616  Exposed Catheter (cm) 0 cm 01/30/23 1616  Site Assessment Clean, Dry, Intact 01/30/23 1616  Line Status Flushed;Saline locked;Blood return noted 01/30/23 1616  Dressing Type Transparent;Securing device 01/30/23 1616  Dressing Status Antimicrobial disc in place 01/30/23 1616  Dressing Intervention New dressing;Other (Comment) 01/30/23 1616  Dressing Change Due 02/06/23 01/30/23 1616       Christella Noa Albarece 01/30/2023, 4:17 PM

## 2023-01-30 NOTE — TOC Progression Note (Signed)
Transition of Care (TOC) - Progression Note    Patient Details  Name: Trevor Wilson. MRN: [id number] Date of Birth: [birth year]  Transition of Care [facility]) CM/SW Contact  Roseanne Kaufman, RN Phone Number: 01/30/2023, 5:49 PM  Clinical Narrative:   Per chart review PICC line has been placed, home IV abx (Pam with [facility])  and HHRN (Cory with [facility])  have been set up.   No additional TOC needs, signing off.    Expected Discharge Plan: [place] Barriers to Discharge: No Barriers Identified  Expected Discharge Plan and Services In-house Referral: NA Discharge Planning Services: CM Consult Post Acute Care Choice: [place] arrangements for the past 2 months: Single Family Home Expected Discharge Date: 01/30/23               DME Arranged: N/A DME Agency: NA       HH Arranged: RN, IV Antibiotics HH Agency: [facility], [facility] Date [place]: 01/29/23 Time [place]: 1202 Representative spoke with at [place]: Jeannene Patella with [facility] for home IV abx and Tommi Rumps with [facility] for [facility]   Social Determinants of Health (SDOH) Interventions SDOH Screenings   Food Insecurity: No Food Insecurity (01/26/2023)  Housing: Low Risk  (01/26/2023)  Transportation Needs: No Transportation Needs (01/26/2023)  Utilities: Not At Risk (01/26/2023)  Tobacco Use: Low Risk  (01/26/2023)    Readmission Risk Interventions     No data to display

## 2023-01-30 NOTE — TOC Progression Note (Signed)
Transition of Care (TOC) - Progression Note    Patient Details  Name: Trevor Wilson. MRN: [id number] Date of Birth: 23-Jul-1951  Transition of Care [facility]) CM/SW Contact  Roseanne Kaufman, RN Phone Number: 01/30/2023, 12:03 PM  Clinical Narrative:    Communicated with Pam with [facility] and Tommi Rumps with [facility] regarding discharge planning. Awaiting PICC, MD to sign OPAT. Notified MD, RN. Awaiting PICC placement    TOC will continue to follow for discharge needs.  Expected Discharge Plan: [place] Barriers to Discharge: Continued Medical Work up  Expected Discharge Plan and Services In-house Referral: NA Discharge Planning Services: CM Consult Post Acute Care Choice: [place] arrangements for the past 2 months: Single Family Home                           HH Arranged: RN, IV Antibiotics HH Agency: [facility] Date HH Agency Contacted: 01/29/23 Time [place]: 1202 Representative spoke with at [place]: [place] with [facility]   Social Determinants of Health ([place]) Interventions SDOH Screenings   Food Insecurity: No Food Insecurity (01/26/2023)  Housing: Low Risk  (01/26/2023)  Transportation Needs: No Transportation Needs (01/26/2023)  Utilities: Not At Risk (01/26/2023)  Tobacco Use: Low Risk  (01/26/2023)    Readmission Risk Interventions     No data to display

## 2023-02-10 ENCOUNTER — Encounter: Payer: Self-pay | Admitting: Infectious Diseases

## 2023-02-10 ENCOUNTER — Ambulatory Visit (INDEPENDENT_AMBULATORY_CARE_PROVIDER_SITE_OTHER): Payer: PPO | Admitting: Infectious Diseases

## 2023-02-10 ENCOUNTER — Inpatient Hospital Stay: Payer: PPO | Admitting: Infectious Diseases

## 2023-02-10 ENCOUNTER — Other Ambulatory Visit: Payer: Self-pay

## 2023-02-10 VITALS — BP 131/85 | HR 73 | Resp 16 | Ht 70.0 in | Wt 167.0 lb

## 2023-02-10 DIAGNOSIS — Z452 Encounter for adjustment and management of vascular access device: Secondary | ICD-10-CM | POA: Insufficient documentation

## 2023-02-10 DIAGNOSIS — N12 Tubulo-interstitial nephritis, not specified as acute or chronic: Secondary | ICD-10-CM

## 2023-02-10 DIAGNOSIS — R7881 Bacteremia: Secondary | ICD-10-CM | POA: Insufficient documentation

## 2023-02-10 DIAGNOSIS — Z79899 Other long term (current) drug therapy: Secondary | ICD-10-CM | POA: Diagnosis not present

## 2023-02-10 NOTE — Progress Notes (Signed)
Patient Active Problem List   Diagnosis Date Noted  . Pyelonephritis 01/28/2023  . NSTEMI (non-ST elevated myocardial infarction) ([place]) 01/28/2023  . Hypervolemia 01/28/2023  . Infection due to ESBL-producing Escherichia coli 01/28/2023  . UTI due to extended-spectrum beta lactamase (ESBL) producing Escherichia coli 01/28/2023  . Gastroesophageal reflux disease 01/28/2023  . Hyponatremia 01/28/2023  . Normocytic anemia 01/28/2023  . Sepsis secondary to UTI ([place]) 01/26/2023  . Prostate cancer ([place]) 01/07/2023  . Malignant neoplasm of prostate ([place]) 11/19/2022    Patient's Medications  New Prescriptions   No medications on file  Previous Medications   ACETAMINOPHEN (TYLENOL) 325 MG TABLET    Take 650 mg by mouth every 6 (six) hours as needed for moderate pain.   ASPIRIN EC 81 MG TABLET    Take 1 tablet (81 mg total) by mouth daily. Swallow whole.   DICLOFENAC (VOLTAREN) 75 MG EC TABLET    Take 75 mg by mouth daily.   MULTIPLE VITAMIN (MULTIVITAMIN PO)    Take 1 tablet by mouth daily.   OMEPRAZOLE (PRILOSEC) 40 MG CAPSULE    Take 40 mg by mouth every morning.   ROSUVASTATIN (CRESTOR) 5 MG TABLET    Take 5 mg by mouth daily.  Modified Medications   No medications on file  Discontinued Medications   No medications on file    Subjective: 72-year-old male with PMH as below including recent laparoscopic radical prostatectomy with pelvic lymph node dissection on 01/07/23 for prostate cancer who presented to the ED on 3/11 with worsening shortness of breath and fevers associated with chills and rigirs for last few days, managed for ESBL E coli bacteremia and pyelonephritis and seen by ID inpatient. Discharged on 3/15 with a course of ertapenem to be completed on 02/05/23   02/10/23 IV ertapenem completed on 3/21. Midline has been removed. Has some difficulty controlling urine bit getting  better every day. Has a fu with urology in first week of June and PCP appt this afternoon.   Review  of Systems: all systems reviewed with pertinent positives and negatives as listed above  Past Medical History:  Diagnosis Date  . Arthritis   . ED (erectile dysfunction) due to arterial insufficiency 05/05/2022   11/05/2021  . GERD (gastroesophageal reflux disease)   . Prostate cancer ([place]) 10/23/2022   05/05/2022, 11/05/2021  . Urinary urgency 05/05/2022   11/05/2021    Social History   Tobacco Use  . Smoking status: Never  . Smokeless tobacco: Never  Vaping Use  . Vaping Use: Never used  Substance Use Topics  . Alcohol use: Not Currently    Comment: Has 2 driniks per month.  . Drug use: Never    No family history on file.  No Known Allergies  Health Maintenance  Topic Date Due  . COVID-19 Vaccine (1) Never done  . DTaP/Tdap/Td (1 - Tdap) Never done  . Zoster Vaccines- Shingrix (1 of 2) Never done  . COLONOSCOPY (Pts 45-15yrs Insurance coverage will need to be confirmed)  Never done  . Pneumonia Vaccine 15+ Years old (1 of 1 - PCV) Never done  . Medicare Annual Wellness (AWV)  02/05/2022  . INFLUENZA VACCINE  06/17/2022  . Hepatitis C Screening  Completed  . HPV VACCINES  Aged Out    Objective:  There were no vitals filed for this visit. There is no height or weight on file to calculate BMI.  Physical Exam Constitutional:      Appearance: Normal appearance.  HENT:     Head: Normocephalic and atraumatic.      Mouth: Mucous membranes are moist.  Eyes:    Conjunctiva/sclera: Conjunctivae normal.     Pupils: Pupils are equal, round, and reactive to light.   Cardiovascular:     Rate and Rhythm: Normal rate and regular rhythm.     Heart sounds: No murmur heard. No friction rub. No gallop.   Pulmonary:     Effort: Pulmonary effort is normal.     Breath sounds: Normal breath sounds.   Abdominal:     General: Non distended     Palpations: soft.   Musculoskeletal:        General: Normal range of motion.   Skin:    General: Skin is warm and dry.      Comments:  Neurological:     General: grossly non focal     Mental Status: awake, alert and oriented to person, place, and time.   Psychiatric:        Mood and Affect: Mood normal.   Lab Results Lab Results  Component Value Date   WBC 5.5 01/30/2023   HGB 9.7 (L) 01/30/2023   HCT 27.9 (L) 01/30/2023   MCV 94.3 01/30/2023   PLT 197 01/30/2023    Lab Results  Component Value Date   CREATININE 0.61 01/30/2023   BUN 12 01/30/2023   NA 133 (L) 01/30/2023   K 4.2 01/30/2023   CL 97 (L) 01/30/2023   CO2 24 01/30/2023    Lab Results  Component Value Date   ALT 186 (H) 01/30/2023   AST 231 (H) 01/30/2023   ALKPHOS 201 (H) 01/30/2023   BILITOT 1.0 01/30/2023    Lab Results  Component Value Date   CHOL 82 01/26/2023   HDL 37 (L) 01/26/2023   LDLCALC 36 01/26/2023   TRIG 45 01/26/2023   CHOLHDL 2.2 01/26/2023   No results found for: "LABRPR", "RPRTITER" No results found for: "HIV1RNAQUANT", "HIV1RNAVL", "CD4TABS"   Problem List Items Addressed This Visit   None   I have personally spent more than 70 minutes involved in face-to-face and non-face-to-face activities for this patient on the day of the visit. Professional time spent includes the following activities: Preparing to see the patient (review of tests), Obtaining and/or reviewing separately obtained history (admission/discharge record), Performing a medically appropriate examination and/or evaluation , Ordering medications/tests/procedures, referring and communicating with other health care professionals, Documenting clinical information in the EMR, Independently interpreting results (not separately reported), Communicating results to the patient/family/caregiver, Counseling and educating the patient/family/caregiver and Care coordination (not separately reported).   Wilber Oliphant, [place] [facility] 02/10/2023, 1:33 PM

## 2023-02-17 ENCOUNTER — Inpatient Hospital Stay: Payer: PPO | Admitting: Infectious Diseases

## 2023-03-02 DIAGNOSIS — I5030 Unspecified diastolic (congestive) heart failure: Secondary | ICD-10-CM | POA: Insufficient documentation

## 2023-03-02 NOTE — Progress Notes (Unsigned)
Cardiology Office Note:    Date:  03/03/2023  ID:  Trevor Wilson., DOB 1951/06/22, MRN [id number] PCP: Darrow Bussing, MD  [facility] Providers Cardiologist:  Meriam Sprague, MD      Patient Profile:   (HFpEF) heart failure with preserved ejection fraction  Occurred in setting of admx for sepsis 01/2023  TTE 01/27/23: EF 55-60, no RWMA, Gr 1 DD, NL RVSF, mod elevated PASP, mild BAE, mild MR, RAP 3, RVSP 51.7 Elevated Troponin in setting of sepsis admx in 01/2023 Hyperlipidemia  Prostate CA s/p prostatectomy 12/2022 GERD Aortic atherosclerosis     History of Present Illness:   Trevor Wilson. is a 72 y.o. male who returns for post hospital f/u. He was admitted 3/11-3/15 with ESBL E. Coli urosepsis due to pyelonephritis. His hospital course was c/b acute (HFpEF) heart failure with preserved ejection fraction and elevated hsTrops ([id number]). TTE showed normal EF, mod pulmonary hypertension. CT was neg for PE. Trop elevation was felt to be due to demand ischemia in the setting of sepsis. He was covered with IV Heparin x 48 hours. He was diuresed with IV Lasix. Plan is to get a CCTA as an OP to r/o CAD. He was noted to have a nonconducted PAC as well on telemetry. No further testing was recommended. He is feeling better since DC from the hospital. His energy is back. He still gets short of breath with some things. He has not had orthopnea, paroxysmal nocturnal dyspnea, edema, syncope. He has not had chest pain.   ROS See HPI    Studies Reviewed:    EKG:  not done  Risk Assessment/Calculations:             Physical Exam:   VS:  BP 110/70   Pulse 64   Ht  (1.778 m)   Wt 162 lb (73.5 kg)   SpO2 96%   BMI 23.24 kg/m    Wt Readings from Last 3 Encounters:  03/03/23 162 lb (73.5 kg)  02/10/23 167 lb (75.8 kg)  01/29/23 167 lb 8.8 oz (76 kg)    Constitutional:      Appearance: Healthy appearance. Not in distress.  Neck:     Vascular: JVD  normal.  Pulmonary:     Breath sounds: Normal breath sounds. No wheezing. No rales.  Cardiovascular:     Normal rate. Regular rhythm. Normal S1. Normal S2.      Murmurs: There is no murmur.  Edema:    Peripheral edema absent.  Abdominal:     Palpations: Abdomen is soft.       ASSESSMENT AND PLAN:   Demand ischemia His hsTrops increased significantly without clear trend. This seems more c/w demand ischemia. However, given the degree of hsTrop elevation, CCTA was recommended to definitively r/o ischemic heart disease. I reviewed this today with the patient and his wife.  Continue Crestor 5 mg once daily Restart ASA 81 mg once daily until CT done Arrange CCTA w FFR F/u 6 mos or sooner if CCTA abnormal  (HFpEF) heart failure with preserved ejection fraction Volume overload occurred in the setting of fluid resuscitation for sepsis. He is currently NYHA II. Volume status is stable. He is not taking any diuretics. There was mild diastolic dysfunction on his echocardiogram. No changes are needed at this time with medical Rx. His pulmonary pressures were elevated on is echocardiogram in the hospital. I suspect this was related to volume excess. However, I will get a f/u  limited echocardiogram to reassess pulmonary pressures.  Pulmonary hypertension, unspecified PASP 51.7 mmHg on echocardiogram in 01/2023 in setting of volume overload. Repeat limited echocardiogram in 6 weeks to recheck PASP.         Dispo:  Return in about 6 months (around 09/02/2023) for Routine Follow Up, w/ Dr. Shari Prows.  Signed, [name], PA-C

## 2023-03-03 ENCOUNTER — Ambulatory Visit: Payer: PPO | Attending: Physician Assistant | Admitting: Physician Assistant

## 2023-03-03 ENCOUNTER — Encounter: Payer: Self-pay | Admitting: Physician Assistant

## 2023-03-03 VITALS — BP 110/70 | HR 64 | Ht 70.0 in | Wt 162.0 lb

## 2023-03-03 DIAGNOSIS — R7989 Other specified abnormal findings of blood chemistry: Secondary | ICD-10-CM

## 2023-03-03 DIAGNOSIS — I272 Pulmonary hypertension, unspecified: Secondary | ICD-10-CM

## 2023-03-03 DIAGNOSIS — I2489 Other forms of acute ischemic heart disease: Secondary | ICD-10-CM

## 2023-03-03 DIAGNOSIS — I5032 Chronic diastolic (congestive) heart failure: Secondary | ICD-10-CM

## 2023-03-03 MED ORDER — METOPROLOL TARTRATE 25 MG PO TABS: ORAL_TABLET | ORAL | 0 refills | Status: DC

## 2023-03-03 NOTE — Assessment & Plan Note (Signed)
His hsTrops increased significantly without clear trend. This seems more c/w demand ischemia. However, given the degree of hsTrop elevation, CCTA was recommended to definitively r/o ischemic heart disease. I reviewed this today with the patient and his wife.  Continue Crestor 5 mg once daily Restart ASA 81 mg once daily until CT done Arrange CCTA w FFR F/u 6 mos or sooner if CCTA abnormal

## 2023-03-03 NOTE — Patient Instructions (Addendum)
Medication Instructions:  Your physician has recommended you make the following change in your medication:   START Aspirin 81 mg taking 1 daily    *If you need a refill on your cardiac medications before your next appointment, please call your pharmacy*   Lab Work: TODAY:  BMET  If you have labs (blood work) drawn today and your tests are completely normal, you will receive your results only by: MyChart Message (if you have MyChart) OR A paper copy in the mail If you have any lab test that is abnormal or we need to change your treatment, we will call you to review the results.   Testing/Procedures: Your physician has requested that you have an limited echocardiogram IN 6 WEEKS. Echocardiography is a painless test that uses sound waves to create images of your heart. It provides your doctor with information about the size and shape of your heart and how well your heart's chambers and valves are working. This procedure takes approximately one hour. There are no restrictions for this procedure. Please do NOT wear cologne, perfume, aftershave, or lotions (deodorant is allowed). Please arrive 15 minutes prior to your appointment time.   Your physician has requested that you have cardiac CT. Cardiac computed tomography (CT) is a painless test that uses an x-ray machine to take clear, detailed pictures of your heart. For further information please visit [email or web address] Please follow instruction sheet as BELOW:    Your cardiac CT will be scheduled at one of the below locations:   [facility] [address] [place], [place] [address] [phone]   Please arrive at the [facility] and Children's Entrance (Entrance C2) of [facility] 30 minutes prior to test start time. You can use the FREE valet parking offered at entrance C (encouraged to control the heart rate for the test)  Proceed to the [facility] Radiology Department (first floor) to check-in and test  prep.  All radiology patients and guests should use entrance C2 at [facility], accessed from [facility], even though the hospital's physical address listed is [address]      Please follow these instructions carefully (unless otherwise directed):  Hold all erectile dysfunction medications at least 3 days (72 hrs) prior to test. (Ie viagra, cialis, sildenafil, tadalafil, etc) We will administer nitroglycerin during this exam.   On the Night Before the Test: Be sure to Drink plenty of water. Do not consume any caffeinated/decaffeinated beverages or chocolate 12 hours prior to your test. Do not take any antihistamines 12 hours prior to your test.   On the Day of the Test: Drink plenty of water until 1 hour prior to the test. Do not eat any food 1 hour prior to test. You may take your regular medications prior to the test.  Take metoprolol (Lopressor) 25 MG two hours prior to test.  THIS HAS BEEN SENT TO [facility].       After the Test: Drink plenty of water. After receiving IV contrast, you may experience a mild flushed feeling. This is normal. On occasion, you may experience a mild rash up to 24 hours after the test. This is not dangerous. If this occurs, you can take Benadryl 25 mg and increase your fluid intake. If you experience trouble breathing, this can be serious. If it is severe call 911 IMMEDIATELY. If it is mild, please call our office.   We will call to schedule your test 2-4 weeks out understanding that some insurance  companies will need an authorization prior to the service being performed.   For non-scheduling related questions, please contact the cardiac imaging nurse navigator should you have any questions/concerns: Rockwell Alexandria, Cardiac Imaging Nurse Navigator Larey Brick, Cardiac Imaging Nurse Navigator [facility] Direct Office Dial: [phone]   For scheduling needs, including cancellations  and rescheduling, please call [place], [phone].    Follow-Up: At [facility], you and your health needs are our priority.  As part of our continuing mission to provide you with exceptional heart care, we have created designated Provider Care Teams.  These Care Teams include your primary Cardiologist (physician) and Advanced Practice Providers (APPs -  Physician Assistants and Nurse Practitioners) who all work together to provide you with the care you need, when you need it.  We recommend signing up for the patient portal called "MyChart".  Sign up information is provided on this After Visit Summary.  MyChart is used to connect with patients for Virtual Visits (Telemedicine).  Patients are able to view lab/test results, encounter notes, upcoming appointments, etc.  Non-urgent messages can be sent to your provider as well.   To learn more about what you can do with MyChart, go to [email or web address].    Your next appointment:   6 month(s)  Provider:   Meriam Sprague, MD     Other Instructions

## 2023-03-03 NOTE — Assessment & Plan Note (Signed)
Volume overload occurred in the setting of fluid resuscitation for sepsis. He is currently NYHA II. Volume status is stable. He is not taking any diuretics. There was mild diastolic dysfunction on his echocardiogram. No changes are needed at this time with medical Rx. His pulmonary pressures were elevated on is echocardiogram in the hospital. I suspect this was related to volume excess. However, I will get a f/u limited echocardiogram to reassess pulmonary pressures.

## 2023-03-03 NOTE — Assessment & Plan Note (Signed)
PASP 51.7 mmHg on echocardiogram in 01/2023 in setting of volume overload. Repeat limited echocardiogram in 6 weeks to recheck PASP.

## 2023-03-04 LAB — BASIC METABOLIC PANEL
BUN/Creatinine Ratio: 17 (ref 10–24)
BUN: 15 mg/dL (ref 8–27)
CO2: 20 mmol/L (ref 20–29)
Calcium: 9.2 mg/dL (ref 8.6–10.2)
Chloride: 100 mmol/L (ref 96–106)
Creatinine, Ser: 0.86 mg/dL (ref 0.76–1.27)
Glucose: 85 mg/dL (ref 70–99)
Potassium: 4.7 mmol/L (ref 3.5–5.2)
Sodium: 137 mmol/L (ref 134–144)
eGFR: 93 mL/min/{1.73_m2} (ref >59)

## 2023-03-04 NOTE — Progress Notes (Signed)
Pt & wife, Trevor Wilson, verbal permission from pt to speak to, has been made aware of normal result and verbalized understanding.  jw

## 2023-03-11 ENCOUNTER — Telehealth (HOSPITAL_COMMUNITY): Payer: Self-pay | Admitting: Emergency Medicine

## 2023-03-11 NOTE — Telephone Encounter (Signed)
Attempted to call patient regarding upcoming cardiac CT appointment. °Left message on voicemail with name and callback number °Kenni Newton RN Navigator Cardiac Imaging °[facility] °[phone] Office °[phone] Cell ° °

## 2023-03-12 ENCOUNTER — Ambulatory Visit (HOSPITAL_BASED_OUTPATIENT_CLINIC_OR_DEPARTMENT_OTHER)
Admission: RE | Admit: 2023-03-12 | Discharge: 2023-03-12 | Disposition: A | Discharge status: Home / Self Care | Payer: PPO | Source: Ambulatory Visit | Attending: Cardiovascular Disease | Admitting: Cardiovascular Disease

## 2023-03-12 ENCOUNTER — Ambulatory Visit (HOSPITAL_COMMUNITY)
Admission: RE | Admit: 2023-03-12 | Discharge: 2023-03-12 | Disposition: A | Discharge status: Home / Self Care | Payer: PPO | Source: Ambulatory Visit | Attending: Physician Assistant | Admitting: Physician Assistant

## 2023-03-12 ENCOUNTER — Other Ambulatory Visit: Payer: Self-pay | Admitting: Cardiovascular Disease

## 2023-03-12 DIAGNOSIS — I2510 Atherosclerotic heart disease of native coronary artery without angina pectoris: Secondary | ICD-10-CM

## 2023-03-12 DIAGNOSIS — I2489 Other forms of acute ischemic heart disease: Secondary | ICD-10-CM | POA: Insufficient documentation

## 2023-03-12 DIAGNOSIS — R7989 Other specified abnormal findings of blood chemistry: Secondary | ICD-10-CM | POA: Insufficient documentation

## 2023-03-12 DIAGNOSIS — R931 Abnormal findings on diagnostic imaging of heart and coronary circulation: Secondary | ICD-10-CM | POA: Insufficient documentation

## 2023-03-12 DIAGNOSIS — K449 Diaphragmatic hernia without obstruction or gangrene: Secondary | ICD-10-CM | POA: Diagnosis not present

## 2023-03-12 DIAGNOSIS — J9811 Atelectasis: Secondary | ICD-10-CM | POA: Insufficient documentation

## 2023-03-12 MED ORDER — IOHEXOL 350 MG/ML SOLN
100.0000 mL | Freq: Once | INTRAVENOUS | Status: AC | PRN
  Administered 2023-03-12: 100 mL via INTRAVENOUS

## 2023-03-12 MED ORDER — NITROGLYCERIN 0.4 MG SL SUBL
SUBLINGUAL_TABLET | SUBLINGUAL | Status: AC
  Filled 2023-03-12: qty 2

## 2023-03-12 MED ORDER — NITROGLYCERIN 0.4 MG SL SUBL
0.8000 mg | SUBLINGUAL_TABLET | SUBLINGUAL | Status: DC | PRN
  Administered 2023-03-12: 0.8 mg via SUBLINGUAL

## 2023-03-13 ENCOUNTER — Encounter: Payer: Self-pay | Admitting: Physician Assistant

## 2023-03-13 ENCOUNTER — Telehealth: Payer: Self-pay | Admitting: Cardiology

## 2023-03-13 ENCOUNTER — Other Ambulatory Visit: Payer: Self-pay | Admitting: Physician Assistant

## 2023-03-13 DIAGNOSIS — I2510 Atherosclerotic heart disease of native coronary artery without angina pectoris: Secondary | ICD-10-CM

## 2023-03-13 HISTORY — DX: Atherosclerotic heart disease of native coronary artery without angina pectoris: I25.10

## 2023-03-13 MED ORDER — ASPIRIN 81 MG PO TBEC: 81.0000 mg | DELAYED_RELEASE_TABLET | Freq: Every day | ORAL | 3 refills | Status: AC

## 2023-03-13 MED ORDER — NITROGLYCERIN 0.4 MG SL SUBL: 0.4000 mg | SUBLINGUAL_TABLET | SUBLINGUAL | 3 refills | Status: AC | PRN

## 2023-03-13 NOTE — Telephone Encounter (Signed)
Patient is returning call in regards to results. Transferred to [place], [place].

## 2023-03-13 NOTE — Telephone Encounter (Signed)
Pt has been made aware of his CCTA results.  All, if any, questions have been answered.

## 2023-03-13 NOTE — Telephone Encounter (Signed)
-----   Message from Beatrice Lecher, [place] sent at 03/13/2023  1:28 PM EDT ----- CCTA does demonstrate coronary artery disease. He has a Ca2+ score 795 (81st percentile), mod plaque in the large vessel on the front of the heart (LAD) and one of its branches (diagonal). The blood flow was evaluated with FFR (fractional flow reserve). There is good blood flow throughout except in a very distal portion of the LAD. This area is likely what contributed to the elevated heart enzymes when he was in the hospital. We can manage this with medications only. He does not need a cardiac catheterization at this time. I reviewed his labs from March. His LDL cholesterol is excellent. Continue current dose of Crestor. He should also be on an aspirin. Continue ASA 81 mg once daily. He has not had chest pain. But, let's send in a Rx for prn NTG to have on hand in case he ever has chest pain. Let's schedule follow up in 3-4 weeks with Dr. Shari Prows or me. PLAN: -Continue ASA 81 mg once daily, Crestor 5 mg once daily  -Rx for prn NTG -F/u appt in 3-4 weeks with Dr. Shari Prows or me  Trevor Newcomer, PA-C    03/13/2023 1:27 PM

## 2023-03-16 ENCOUNTER — Telehealth: Payer: Self-pay | Admitting: Cardiology

## 2023-03-16 NOTE — Telephone Encounter (Signed)
Spoke to the patient wife Bjorn Loser (on [place]) mentioned pt did not understand the Cardiac CT test results. Explained test results to wife, she will like to know which medication can be used to manage blood flow distal part of the LAD. Will forward to APP for advise.

## 2023-03-16 NOTE — Telephone Encounter (Signed)
Patient's wife is calling again to get results from CT test. Requesting call back.

## 2023-03-17 MED ORDER — ISOSORBIDE MONONITRATE ER 30 MG PO TB24: 15.0000 mg | ORAL_TABLET | Freq: Every day | ORAL | 3 refills | Status: DC

## 2023-03-17 NOTE — Telephone Encounter (Signed)
Called patient and reviewed findings on CCTA. He does still get short of breath with certain activities.  F/u echocardiogram to recheck pulmonary hypertension is in late May with f/u OV after. He has not had chest pain. ?If shortness of breath related to coronary artery disease (?anginal equiv). He does not take PDE-5 inhibitors.  We discussed the dangers of taking nitrates along with PDE-5 inhibitors. PLAN: -Start Imdur 15 mg once daily -F/u echocardiogram in late May as planned -F/u OV in late May after echocardiogram. [name], PA-C    03/17/2023 4:56 PM

## 2023-03-20 ENCOUNTER — Telehealth: Payer: Self-pay | Admitting: Cardiology

## 2023-03-20 NOTE — Telephone Encounter (Signed)
Spoke to the patient wife Bjorn Loser ( on [place]) explained [name] recommendations:   He has coronary artery disease noted on his recent chest CT. ASA is recommended for prevention of heart attacks. There is an increased risk of bleeding with ASA. But the benefits outweigh the risks. If he develops bleeding problems in the future, we may have to take him off of ASA. The Imdur was started b/c he complained of shortness of breath when I spoke to him on the phone recently about his CT scan results. There are some blockages on his CT that can be treated medically. If his shortness of breath is related to those blockages, the Imdur should help. If it is not helping him at his follow up, we can likely stop it. Voltaren should be avoided if possible. It can increase the risk of bleeding. There is an increased assoc with heart attacks in patients who have know coronary artery disease. Tylenol is a better medication to take for pain. If he has to take just an occasional Voltaren as needed, that is ok. But if he needs to take it on a chronic, daily basis, he should discuss with the prescriber of the Voltaren to decide on alternative Rx.   Rhonda voiced understanding.

## 2023-03-20 NOTE — Telephone Encounter (Signed)
Pt c/o medication issue:  1. Name of Medication:  diclofenac (VOLTAREN) 75 MG EC tablet isosorbide mononitrate (IMDUR) 30 MG 24 hr tablet  2. How are you currently taking this medication (dosage and times per day)?   3. Are you having a reaction (difficulty breathing--STAT)?   4. What is your medication issue?   Patient's wife states she PCP has concerns with her taking baby Aspirin while on Diclorenac. She would also like you clarify whether it is necessary for her to be taking Isosorbide Mononitrate.

## 2023-03-20 NOTE — Telephone Encounter (Signed)
He has coronary artery disease noted on his recent chest CT. ASA is recommended for prevention of heart attacks. There is an increased risk of bleeding  with ASA. But the benefits outweigh the risks. If he develops bleeding problems in the future, we may have to take him off of ASA. The Imdur was started b/c he complained of shortness of breath when I spoke to him on the phone recently about his CT scan results. There are some blockages on his CT that can be treated medically. If his shortness of breath is related to those blockages, the Imdur should help. If it is not helping him at his follow up, we can likely stop it. Voltaren should be avoided if possible. It can increase the risk of bleeding. There is an increased assoc with heart attacks in patients who have know coronary artery disease. Tylenol is a better medication to take for pain. If he has to take just an occasional Voltaren as needed, that is ok. But if he needs to take it on a chronic, daily basis, he should discuss with the prescriber of the Voltaren to decide on alternative Rx.  [name], PA-C    03/20/2023 2:00 PM

## 2023-03-20 NOTE — Telephone Encounter (Signed)
Spoke to the patients wife Bjorn Loser (on [place]) mentioned pt has an appointment with PCP next week. Earlier this year, Pt was taking Asprin 81 mg and Voltaren, however, PCP advised pt this taking these medications are not recommended and pt d'c/d Asprin. Bjorn Loser stated pt was advised on 4/29 by APP to restart taking Asprin 81 mg. Pt wife wanted to clarify if this is safe for the patient.  On 4/29 pt was prescribed Isosorbide mononitrate, pt wife will like to know why was this medication was prescribed. Pt does not monitor blood pressure at home, pt wife will know why her husband start monitor is bp. Advised pt wife APP is not in the office today, Bjorn Loser voiced understanding. Will forward to APP for advise.

## 2023-04-14 ENCOUNTER — Encounter: Payer: Self-pay | Admitting: Physician Assistant

## 2023-04-14 ENCOUNTER — Ambulatory Visit (HOSPITAL_COMMUNITY): Payer: PPO | Attending: Physician Assistant

## 2023-04-14 DIAGNOSIS — I272 Pulmonary hypertension, unspecified: Secondary | ICD-10-CM | POA: Insufficient documentation

## 2023-04-14 DIAGNOSIS — I5032 Chronic diastolic (congestive) heart failure: Secondary | ICD-10-CM | POA: Diagnosis present

## 2023-04-14 DIAGNOSIS — I340 Nonrheumatic mitral (valve) insufficiency: Secondary | ICD-10-CM | POA: Insufficient documentation

## 2023-04-14 HISTORY — DX: Nonrheumatic mitral (valve) insufficiency: I34.0

## 2023-04-14 LAB — ECHOCARDIOGRAM LIMITED
Area-P 1/2: 2.62 cm2
S' Lateral: 3.1 cm

## 2023-04-14 NOTE — Progress Notes (Signed)
Cardiology Office Note:    Date:  04/15/2023  ID:  Arlington Calix., DOB 06-Apr-1951, MRN [id number] PCP: Darrow Bussing, MD  [facility] Providers Cardiologist:  Meriam Sprague, MD       Patient Profile:      Coronary artery disease  CCTA 03/12/2023: CAC score 795 (81st percentile).  LAD proximal/mid 50-69, distal LAD 25-49; D1 ostial and proximal 50-69; RCA proximal 1-24 FFR > 0.8 except in apical LAD (0.77) (HFpEF) heart failure with preserved ejection fraction  Occurred in setting of admx for sepsis 01/2023  TTE 01/27/23: EF 55-60, no RWMA, Gr 1 DD, NL RVSF, mod elevated PASP, mild BAE, mild MR, RAP 3, RVSP 51.7 Limited TTE 04/14/23: EF 60-65, G1 DD, normal RVSF, normal PASP, RVSP 29.9, mild MR, mild AI, RAP 8 Elevated Troponin in setting of sepsis admx in 01/2023 Pulmonary hypertension  RVSP 01/2023: 51.7 RVSP 03/2023: 29.9 Hyperlipidemia  Prostate CA s/p prostatectomy 12/2022 GERD Aortic atherosclerosis       History of Present Illness:   Abran Borsa. is a 72 y.o. male who returns for f/u of CHF, CAD. He was last seen 03/03/23 after an admission with E.coli urosepsis after a prostatectomy. He developed volume overload and elevated Troponins (peak 1591). RVSP on his TTE in the hospital was 51.7. He was set up for a limited echocardiogram to reassess RVSP and a CCTA to assess for significant coronary artery disease. CCTA demonstrated mod nonobstructive CAD. His FFR was only positive in the most apical portion of the LAD. Aggressive risk factor modification is recommended. I discussed his results with him and he continue to have dyspnea on exertion. I started him on low dose Imdur in case his shortness of breath is related to his CAD. His f/u limited echocardiogram showed normal EF, mild MR, mild AI and improved/normal PASP.  He is here today with his wife.  He had headache with isosorbide and stopped this.  He has been increasing his activity and feels much better.   He has not had significant shortness of breath, chest pain, syncope, leg edema, orthopnea.  ROS  See HPI    Studies Reviewed:    EKG:  not done   Risk Assessment/Calculations:             Physical Exam:   VS:  BP 115/70   Pulse 64   Ht 5\' 6"  (1.676 m)   Wt 159 lb 12.8 oz (72.5 kg)   SpO2 98%   BMI 25.79 kg/m    Wt Readings from Last 3 Encounters:  04/15/23 159 lb 12.8 oz (72.5 kg)  03/03/23 162 lb (73.5 kg)  02/10/23 167 lb (75.8 kg)    Constitutional:      Appearance: Healthy appearance. Not in distress.  Pulmonary:     Breath sounds: Normal breath sounds. No wheezing. No rales.  Cardiovascular:     Normal rate. Regular rhythm.     Murmurs: There is no murmur.  Edema:    Peripheral edema absent.       ASSESSMENT AND PLAN:   CAD (coronary artery disease) Moderate non-obstructive coronary artery disease on CCTA. FFR was only positive in the most apical LAD. I tried him on Imdur to see if this would relieve his shortness of breath (?anginal equivalent). But he had to stop it due to HA. He feels much better with increasing his activity. Therefore, I think his shortness of breath was mainly due to deconditioning. His cholesterol is excellent.  Labs from PCP reviewed via [facility]. His LDL earlier this month was 55. He also remains on ASA 81 mg once daily. He exercises and has a good diet. He does not have diabetes and does not smoke. No changes in his current regimen. F/u 1 year.   (HFpEF) heart failure with preserved ejection fraction (HCC) Volume overload occurred in the setting of fluid resuscitation for sepsis. RVSP returned to normal on recent Echocardiogram.   Mild mitral regurgitation Consider repeat echocardiogram in 3-4 years.     Dispo:  Return in about 1 year (around 04/14/2024) for Routine Follow Up, w/ [name], PA-C.  Signed, [name], PA-C

## 2023-04-15 ENCOUNTER — Ambulatory Visit: Payer: PPO | Attending: Physician Assistant | Admitting: Physician Assistant

## 2023-04-15 ENCOUNTER — Encounter: Payer: Self-pay | Admitting: Physician Assistant

## 2023-04-15 VITALS — BP 115/70 | HR 64 | Ht 66.0 in | Wt 159.8 lb

## 2023-04-15 DIAGNOSIS — I340 Nonrheumatic mitral (valve) insufficiency: Secondary | ICD-10-CM | POA: Diagnosis not present

## 2023-04-15 DIAGNOSIS — I25119 Atherosclerotic heart disease of native coronary artery with unspecified angina pectoris: Secondary | ICD-10-CM

## 2023-04-15 DIAGNOSIS — I5032 Chronic diastolic (congestive) heart failure: Secondary | ICD-10-CM | POA: Diagnosis not present

## 2023-04-15 NOTE — Patient Instructions (Signed)
Medication Instructions:  Your physician recommends that you continue on your current medications as directed. Please refer to the Current Medication list given to you today.  *If you need a refill on your cardiac medications before your next appointment, please call your pharmacy*   Lab Work: None ordered  If you have labs (blood work) drawn today and your tests are completely normal, you will receive your results only by: MyChart Message (if you have MyChart) OR A paper copy in the mail If you have any lab test that is abnormal or we need to change your treatment, we will call you to review the results.   Testing/Procedures: None ordered   Follow-Up: At [facility], you and your health needs are our priority.  As part of our continuing mission to provide you with exceptional heart care, we have created designated Provider Care Teams.  These Care Teams include your primary Cardiologist (physician) and Advanced Practice Providers (APPs -  Physician Assistants and Nurse Practitioners) who all work together to provide you with the care you need, when you need it.  We recommend signing up for the patient portal called "MyChart".  Sign up information is provided on this After Visit Summary.  MyChart is used to connect with patients for Virtual Visits (Telemedicine).  Patients are able to view lab/test results, encounter notes, upcoming appointments, etc.  Non-urgent messages can be sent to your provider as well.   To learn more about what you can do with MyChart, go to [email or web address].    Your next appointment:   1 year(s)  Provider:   Scott Weaver, PA-C         Other Instructions   

## 2023-04-15 NOTE — Assessment & Plan Note (Signed)
Moderate non-obstructive coronary artery disease on CCTA. FFR was only positive in the most apical LAD. I tried him on Imdur to see if this would relieve his shortness of breath (?anginal equivalent). But he had to stop it due to HA. He feels much better with increasing his activity. Therefore, I think his shortness of breath was mainly due to deconditioning. His cholesterol is excellent. Labs from PCP reviewed via [facility]. His LDL earlier this month was 55. He also remains on ASA 81 mg once daily. He exercises and has a good diet. He does not have diabetes and does not smoke. No changes in his current regimen. F/u 1 year.

## 2023-04-15 NOTE — Assessment & Plan Note (Signed)
Volume overload occurred in the setting of fluid resuscitation for sepsis. RVSP returned to normal on recent Echocardiogram.

## 2023-04-15 NOTE — Assessment & Plan Note (Signed)
Consider repeat echocardiogram in 3-4 years.
